# Patient Record
Sex: Male | Born: 1980 | Race: Black or African American | Hispanic: No | Marital: Single | Smoking: Current every day smoker
Health system: Southern US, Community
[De-identification: ages and names within clinical notes are randomized; demographics above are authoritative.]

---

## 2000-10-14 ENCOUNTER — Emergency Department (HOSPITAL_COMMUNITY): Admission: EM | Admit: 2000-10-14 | Discharge: 2000-10-14 | Payer: Self-pay

## 2001-01-14 ENCOUNTER — Encounter: Payer: Self-pay | Admitting: Emergency Medicine

## 2001-01-14 ENCOUNTER — Emergency Department (HOSPITAL_COMMUNITY): Admission: EM | Admit: 2001-01-14 | Discharge: 2001-01-14 | Payer: Self-pay | Admitting: Emergency Medicine

## 2004-01-24 ENCOUNTER — Emergency Department (HOSPITAL_COMMUNITY): Admission: EM | Admit: 2004-01-24 | Discharge: 2004-01-24 | Payer: Self-pay | Admitting: Emergency Medicine

## 2004-09-21 ENCOUNTER — Inpatient Hospital Stay (HOSPITAL_COMMUNITY): Admission: EM | Admit: 2004-09-21 | Discharge: 2004-09-24 | Payer: Self-pay | Admitting: Emergency Medicine

## 2004-12-10 ENCOUNTER — Emergency Department (HOSPITAL_COMMUNITY): Admission: EM | Admit: 2004-12-10 | Discharge: 2004-12-10 | Payer: Self-pay | Admitting: Emergency Medicine

## 2004-12-31 ENCOUNTER — Emergency Department (HOSPITAL_COMMUNITY): Admission: EM | Admit: 2004-12-31 | Discharge: 2004-12-31 | Payer: Self-pay | Admitting: Emergency Medicine

## 2005-02-15 ENCOUNTER — Emergency Department (HOSPITAL_COMMUNITY): Admission: EM | Admit: 2005-02-15 | Discharge: 2005-02-15 | Payer: Self-pay | Admitting: Emergency Medicine

## 2008-08-10 ENCOUNTER — Emergency Department (HOSPITAL_COMMUNITY): Admission: EM | Admit: 2008-08-10 | Discharge: 2008-08-10 | Payer: Self-pay | Admitting: Emergency Medicine

## 2009-12-02 ENCOUNTER — Emergency Department (HOSPITAL_COMMUNITY): Admission: EM | Admit: 2009-12-02 | Discharge: 2009-12-02 | Payer: Self-pay | Admitting: Family Medicine

## 2010-03-11 ENCOUNTER — Emergency Department (HOSPITAL_COMMUNITY)
Admission: EM | Admit: 2010-03-11 | Discharge: 2010-03-11 | Payer: Self-pay | Source: Home / Self Care | Admitting: Emergency Medicine
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
## 2010-08-09 NOTE — Discharge Summary (Signed)
Joe Martin, Joe Martin NO.:  [id number]   MEDICAL RECORD NO.:  [id number]          PATIENT TYPE:  INP   LOCATION:  5033                         FACILITY:  [facility]   PHYSICIAN:  Sandria Bales. Ezzard Standing, M.D.  DATE OF BIRTH:  1980/05/10   DATE OF ADMISSION:  09/21/2004  DATE OF DISCHARGE:                                 DISCHARGE SUMMARY   ADDENDUM:  The patient was unable to be discharged home as directed because  he did not mobilize well with his crutches.  He had a rolling walker ordered  at the physical therapist's recommendation and mobilized fair with that.  He  made decent progress over the next day and was ready to be discharged home  on July 4 in good condition.  Dr. Sherlean Foot will follow him up as directed.       MJ/MEDQ  D:  09/24/2004  T:  09/24/2004  Job:  [id number]
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
## 2011-12-27 ENCOUNTER — Emergency Department (HOSPITAL_COMMUNITY)
Admission: EM | Admit: 2011-12-27 | Discharge: 2011-12-27 | Disposition: A | Discharge status: Home / Self Care | Payer: Self-pay | Source: Home / Self Care | Attending: Emergency Medicine | Admitting: Emergency Medicine

## 2011-12-27 ENCOUNTER — Encounter (HOSPITAL_COMMUNITY): Payer: Self-pay | Admitting: Emergency Medicine

## 2011-12-27 DIAGNOSIS — L0291 Cutaneous abscess, unspecified: Secondary | ICD-10-CM

## 2011-12-27 DIAGNOSIS — L0390 Cellulitis, unspecified: Secondary | ICD-10-CM

## 2011-12-27 DIAGNOSIS — B353 Tinea pedis: Secondary | ICD-10-CM

## 2011-12-27 MED ORDER — DOXYCYCLINE HYCLATE 100 MG PO CAPS: 100.0000 mg | ORAL_CAPSULE | Freq: Two times a day (BID) | ORAL | Status: AC

## 2011-12-27 MED ORDER — NYSTATIN-TRIAMCINOLONE 100000-0.1 UNIT/GM-% EX CREA: TOPICAL_CREAM | CUTANEOUS | Status: AC

## 2011-12-27 MED ORDER — DOXYCYCLINE HYCLATE 100 MG PO CAPS: 100.0000 mg | ORAL_CAPSULE | Freq: Two times a day (BID) | ORAL | Status: DC

## 2011-12-27 MED ORDER — NYSTATIN-TRIAMCINOLONE 100000-0.1 UNIT/GM-% EX CREA: TOPICAL_CREAM | CUTANEOUS | Status: DC

## 2011-12-27 NOTE — ED Notes (Signed)
Pt c/o fungus in between toes of right foot x4 months... Has been seen here in the past for this... Sx include: pain... Denies: fever, nausea, vomiting, diarrhea, drainage... Tried Lamisil w/little relief.

## 2011-12-27 NOTE — ED Provider Notes (Signed)
History     CSN: [id number]  Arrival date & time 12/27/11  1227   First MD Initiated Contact with Patient 12/27/11 1339      Chief Complaint  Patient presents with  . Nail Problem    (Consider location/radiation/quality/duration/timing/severity/associated sxs/prior treatment) HPI Comments: Patient with a recurrent infection on his right foot in between the fourth and fifth toe she describes appointments wet or Foley discharge. It comes and goes he has been treating it for about 4 months some over-the-counter medicines. Patient denies any injury or trauma to his foot. Describe some tenderness in between the space of the fourth and fifth toe of his right foot denies, any fever chills or further symptoms.    The history is provided by the patient.    History reviewed. No pertinent past medical history.  History reviewed. No pertinent past surgical history.  No family history on file.  History  Substance Use Topics  . Smoking status: Never Smoker   . Smokeless tobacco: Not on file  . Alcohol Use: No      Review of Systems  Constitutional: Negative for chills and unexpected weight change.  Skin: Positive for rash.  Neurological: Negative for numbness.    Allergies  Peanut-containing drug products  Home Medications   Current Outpatient Rx  Name Route Sig Dispense Refill  . DOXYCYCLINE HYCLATE 100 MG PO CAPS Oral Take 1 capsule (100 mg total) by mouth 2 (two) times daily. 20 capsule 0  . NYSTATIN-TRIAMCINOLONE 100000-0.1 UNIT/GM-% EX CREA  Apply to affected area daily x 2 weeks 15 g 0    BP 123/87  Pulse 97  Temp 98 F (36.7 C) (Oral)  Resp 18  SpO2 97%  Physical Exam  Nursing note and vitals reviewed. Constitutional: Vital signs are normal. He appears well-developed and well-nourished. He does not have a sickly appearance. He does not appear ill.  Musculoskeletal:       Right foot: He exhibits normal range of motion, no bony tenderness, no swelling, normal  capillary refill, no crepitus and no deformity.       Feet:  Skin: No rash noted. There is erythema.    ED Course  Procedures (including critical care time)  Labs Reviewed - No data to display No results found.   1. Recurrent tinea pedis   2. Cellulitis       MDM  Intertriginous possibly candidal infection with associated cellulitis. Patient was prescribed nystatin transilluminal cream along with cycle of doxycycline. Have encouraged patient to followup if no improvement is noted in 5 days patient agrees and understands treatment plan and followup care as necessary.      Jimmie Molly, MD 12/27/11 2010

## 2012-06-10 ENCOUNTER — Encounter (HOSPITAL_COMMUNITY): Payer: Self-pay | Admitting: *Deleted

## 2012-06-10 ENCOUNTER — Emergency Department (INDEPENDENT_AMBULATORY_CARE_PROVIDER_SITE_OTHER): Payer: Self-pay

## 2012-06-10 ENCOUNTER — Emergency Department (INDEPENDENT_AMBULATORY_CARE_PROVIDER_SITE_OTHER)
Admission: EM | Admit: 2012-06-10 | Discharge: 2012-06-10 | Disposition: A | Discharge status: Home / Self Care | Payer: Self-pay | Source: Home / Self Care | Attending: Family Medicine | Admitting: Family Medicine

## 2012-06-10 DIAGNOSIS — S92919A Unspecified fracture of unspecified toe(s), initial encounter for closed fracture: Secondary | ICD-10-CM

## 2012-06-10 DIAGNOSIS — S92911A Unspecified fracture of right toe(s), initial encounter for closed fracture: Secondary | ICD-10-CM

## 2012-06-10 DIAGNOSIS — B353 Tinea pedis: Secondary | ICD-10-CM

## 2012-06-10 MED ORDER — IBUPROFEN 600 MG PO TABS: 600.0000 mg | ORAL_TABLET | Freq: Three times a day (TID) | ORAL | Status: DC | PRN

## 2012-06-10 MED ORDER — KETOCONAZOLE 2 % EX CREA: TOPICAL_CREAM | Freq: Every day | CUTANEOUS | Status: DC

## 2012-06-10 MED ORDER — TRAMADOL HCL 50 MG PO TABS: 50.0000 mg | ORAL_TABLET | Freq: Four times a day (QID) | ORAL | Status: DC | PRN
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
## 2012-06-10 NOTE — ED Notes (Signed)
lg  Adult     Post  Op  Shoe  Applied     To  r  Foot
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
## 2017-03-20 ENCOUNTER — Ambulatory Visit (HOSPITAL_COMMUNITY)
Admission: EM | Admit: 2017-03-20 | Discharge: 2017-03-20 | Disposition: A | Discharge status: Home / Self Care | Payer: Self-pay | Attending: Internal Medicine | Admitting: Internal Medicine

## 2017-03-20 ENCOUNTER — Encounter (HOSPITAL_COMMUNITY): Payer: Self-pay | Admitting: Emergency Medicine

## 2017-03-20 DIAGNOSIS — L84 Corns and callosities: Secondary | ICD-10-CM

## 2017-03-20 DIAGNOSIS — M79671 Pain in right foot: Secondary | ICD-10-CM

## 2017-03-20 MED ORDER — KETOCONAZOLE 2 % EX CREA: TOPICAL_CREAM | Freq: Every day | CUTANEOUS | 0 refills | Status: DC

## 2017-03-20 MED ORDER — IBUPROFEN 600 MG PO TABS: 600.0000 mg | ORAL_TABLET | Freq: Four times a day (QID) | ORAL | 0 refills | Status: DC | PRN

## 2017-03-20 NOTE — ED Triage Notes (Signed)
PT also wonders if he has athlete's foot.

## 2017-03-20 NOTE — Discharge Instructions (Signed)
Arch support in shoes may be helpful with your pain. Ice and elevate after walking.  Ibuprofen as needed for pain.  Please follow up with podiatry for corn reevaluation and foot pain reevaluation as needed.

## 2017-03-20 NOTE — ED Provider Notes (Signed)
[facility]    CSN: [id number] Arrival date & time: 03/20/17  1607     History   Chief Complaint Chief Complaint  Patient presents with  . Foot Pain    HPI Joe Martin is a 36 y.o. male.   Joe Martin presents with complaints of right plantar foot pain which is worse when he is on his feet for a long period of time. Worse with standing. Without injury. Symptoms have been present for the past 1.5 weeks. Burning pain. Denies previous similar. Has tried another pair of shoes but this did not help. Also with irritation in between pinky toe and 4th toe. Has been applying an ointment which helps. Rates pain 7/10. Has not taken any medications for his symptoms. Without itching.   ROS per HPI.       History reviewed. No pertinent past medical history.  There are no active problems to display for this patient.   History reviewed. No pertinent surgical history.     Home Medications    Prior to Admission medications   Medication Sig Start Date End Date Taking? Authorizing Provider  ibuprofen (ADVIL,MOTRIN) 600 MG tablet Take 1 tablet (600 mg total) by mouth every 6 (six) hours as needed. 03/20/17   Zigmund Gottron, NP  ketoconazole (NIZORAL) 2 % cream Apply topically daily. 03/20/17   Zigmund Gottron, NP  traMADol (ULTRAM) 50 MG tablet Take 1 tablet (50 mg total) by mouth every 6 (six) hours as needed for pain. 06/10/12   Moreno-Coll, Adlih, MD    Family History No family history on file.  Social History Social History   Tobacco Use  . Smoking status: Current Every Day Smoker    Packs/day: 0.30    Types: Cigarettes  . Smokeless tobacco: Never Used  Substance Use Topics  . Alcohol use: Yes  . Drug use: No     Allergies   Peanut-containing drug products   Review of Systems Review of Systems   Physical Exam Triage Vital Signs ED Triage Vitals  Enc Vitals Group     BP 03/20/17 1628 103/87     Pulse Rate 03/20/17 1628 67     Resp 03/20/17  1628 16     Temp 03/20/17 1628 98.6 F (37 C)     Temp Source 03/20/17 1628 Oral     SpO2 03/20/17 1628 100 %     Weight 03/20/17 1628 195 lb (88.5 kg)     Height 03/20/17 1628 5\' 7"  (1.702 m)     Head Circumference --      Peak Flow --      Pain Score 03/20/17 1629 7     Pain Loc --      Pain Edu? --      Excl. in [place]? --    No data found.  Updated Vital Signs BP 103/87   Pulse 67   Temp 98.6 F (37 C) (Oral)   Resp 16   Ht 5\' 7"  (1.702 m)   Wt 195 lb (88.5 kg)   SpO2 100%   BMI 30.54 kg/m   Visual Acuity Right Eye Distance:   Left Eye Distance:   Bilateral Distance:    Right Eye Near:   Left Eye Near:    Bilateral Near:     Physical Exam  Constitutional: He is oriented to person, place, and time. He appears well-developed and well-nourished.  Cardiovascular: Normal rate and regular rhythm.  Pulmonary/Chest: Effort normal and breath sounds normal.  Musculoskeletal:  Right foot: There is normal range of motion, no tenderness, no bony tenderness, no swelling, normal capillary refill, no crepitus, no deformity and no laceration.       Feet:  Corn to medial pinky toe noted; plantar foot without reproducible tenderness; without rash; full sensation, full ROM; indicates pain is at his arch  Neurological: He is alert and oriented to person, place, and time.  Skin: Skin is warm and dry.     UC Treatments / Results  Labs (all labs ordered are listed, but only abnormal results are displayed) Labs Reviewed - No data to display  EKG  EKG Interpretation None       Radiology No results found.  Procedures Procedures (including critical care time)  Medications Ordered in UC Medications - No data to display   Initial Impression / Assessment and Plan / UC Course  I have reviewed the triage vital signs and the nursing notes.  Pertinent labs & imaging results that were available during my care of the patient were reviewed by me and considered in my medical  decision making (see chart for details).     Unable to reproduce pain during exam. Recommended better arch supports as patient's shoes he is wearing currently lack this. May place inserts or get shoes with greater support. Corn present. Without current tinea presence, patient requests refills of ketoconazole for prn use. Recommended podiatry follow up. Ibuprofen, ice, elevation. Patient verbalized understanding and agreeable to plan.  Ambulatory out of clinic without difficulty.    Final Clinical Impressions(s) / UC Diagnoses   Final diagnoses:  Foot pain, right  Corn of toe    ED Discharge Orders        Ordered    ketoconazole (NIZORAL) 2 % cream  Daily     03/20/17 1655    ibuprofen (ADVIL,MOTRIN) 600 MG tablet  Every 6 hours PRN     03/20/17 1655       Controlled Substance Prescriptions [place] Controlled Substance Registry consulted? Not Applicable   Zigmund Gottron, NP 03/20/17 [phone]

## 2017-03-20 NOTE — ED Triage Notes (Signed)
PT reports right foot pain for 1 week. No injury. PT has tried different shoes without relief.

## 2018-02-24 ENCOUNTER — Encounter (HOSPITAL_COMMUNITY): Payer: Self-pay

## 2018-02-24 ENCOUNTER — Ambulatory Visit (HOSPITAL_COMMUNITY)
Admission: EM | Admit: 2018-02-24 | Discharge: 2018-02-24 | Disposition: A | Discharge status: Home / Self Care | Payer: Self-pay | Attending: Family Medicine | Admitting: Family Medicine

## 2018-02-24 ENCOUNTER — Telehealth (HOSPITAL_COMMUNITY): Payer: Self-pay | Admitting: Emergency Medicine

## 2018-02-24 DIAGNOSIS — D649 Anemia, unspecified: Secondary | ICD-10-CM | POA: Insufficient documentation

## 2018-02-24 DIAGNOSIS — R200 Anesthesia of skin: Secondary | ICD-10-CM | POA: Insufficient documentation

## 2018-02-24 DIAGNOSIS — Z791 Long term (current) use of non-steroidal anti-inflammatories (NSAID): Secondary | ICD-10-CM | POA: Insufficient documentation

## 2018-02-24 DIAGNOSIS — R202 Paresthesia of skin: Secondary | ICD-10-CM | POA: Insufficient documentation

## 2018-02-24 DIAGNOSIS — Z79899 Other long term (current) drug therapy: Secondary | ICD-10-CM | POA: Insufficient documentation

## 2018-02-24 DIAGNOSIS — F17210 Nicotine dependence, cigarettes, uncomplicated: Secondary | ICD-10-CM | POA: Insufficient documentation

## 2018-02-24 DIAGNOSIS — G479 Sleep disorder, unspecified: Secondary | ICD-10-CM | POA: Insufficient documentation

## 2018-02-24 DIAGNOSIS — R531 Weakness: Secondary | ICD-10-CM | POA: Insufficient documentation

## 2018-02-24 LAB — POCT URINALYSIS DIP (DEVICE)
BILIRUBIN URINE: NEGATIVE
GLUCOSE, UA: NEGATIVE mg/dL
Ketones, ur: NEGATIVE mg/dL
LEUKOCYTES UA: NEGATIVE
NITRITE: NEGATIVE
Protein, ur: NEGATIVE mg/dL
SPECIFIC GRAVITY, URINE: 1.015 (ref 1.005–1.030)
UROBILINOGEN UA: 0.2 mg/dL (ref 0.0–1.0)
pH: 6 (ref 5.0–8.0)

## 2018-02-24 LAB — TSH: TSH: 0.863 u[IU]/mL (ref 0.350–4.500)

## 2018-02-24 LAB — POCT I-STAT, CHEM 8
BUN: 18 mg/dL (ref 6–20)
CHLORIDE: 101 mmol/L (ref 98–111)
Calcium, Ion: 1.18 mmol/L (ref 1.15–1.40)
Creatinine, Ser: 1 mg/dL (ref 0.61–1.24)
Glucose, Bld: 86 mg/dL (ref 70–99)
HEMATOCRIT: 37 % — AB (ref 39.0–52.0)
HEMOGLOBIN: 12.6 g/dL — AB (ref 13.0–17.0)
POTASSIUM: 3.7 mmol/L (ref 3.5–5.1)
Sodium: 138 mmol/L (ref 135–145)
TCO2: 29 mmol/L (ref 22–32)

## 2018-02-24 NOTE — ED Triage Notes (Signed)
Pt presents with fatigue, weakness, and pain on right side that radiates from leg down to foot.

## 2018-02-24 NOTE — Telephone Encounter (Signed)
TSH WNL.  PT should FU with PCP for further evaluation and management of chronic generalized weakness.

## 2018-02-24 NOTE — Discharge Instructions (Signed)
Blood work did not show signs of electrolyte abnormalities, or elevated blood sugar.  Mild anemia.  Urine showed trace blood.   We will check your thyroid today.  We will call with any abnormal results.   Recommend drinking plenty of water, at least half your body weight in ounces.  Eat a well-balanced diet of lean meat, fruits, and vegetables.   Exercise daily for at least 30 minutes.  Make sure to incorporate both cardio and strength training exercises  Recommending further evaluation and management with PCP PCP assistance initiated Return or go to the ED if you have any new or worsening symptoms such as chest pain, shortness of breath, nausea, vomiting, abdominal pain, lightheadedness, dizziness, etc..Marland Kitchen

## 2018-02-24 NOTE — ED Provider Notes (Signed)
[place]   [id number] 02/24/18 Arrival Time: 2440  CC: Generalized weakness  SUBJECTIVE:  Joe Martin is a 37 y.o. male who complains of generalized weakness for the past couple of months.  States symptoms began after he stopped working out regularly.  Denies recent medication change or starting a new medication.  Admits to poor diet choices, but denies changes in diet.  Denies hx of chronic medical problems.  Take medication for hair loss, unsure of name of medication.  Complains of associated numbness and tingling in his extremities, especially the lower extremities, increased urination, increased thirst, and difficulty sleeping.  Denies aggravating factors.  Tried working out again with minimal relief in numbness and tingling in lower extremities.  Patient denies fever, chills, nausea, vomiting, vision changes, chest pain, SOB, abdominal pain, changes in bowel or bladder habits, hematochezia, melena, temperature sensitivity.    ROS: As per HPI.  Family hx significant for DM in mother and father Father with prostate cancer Mother with MS  History reviewed. No pertinent past medical history. History reviewed. No pertinent surgical history. Allergies  Allergen Reactions  . Peanut-Containing Drug Products    No current facility-administered medications on file prior to encounter.    Current Outpatient Medications on File Prior to Encounter  Medication Sig Dispense Refill  . ibuprofen (ADVIL,MOTRIN) 600 MG tablet Take 1 tablet (600 mg total) by mouth every 6 (six) hours as needed. 30 tablet 0  . ketoconazole (NIZORAL) 2 % cream Apply topically daily. 15 g 0  . traMADol (ULTRAM) 50 MG tablet Take 1 tablet (50 mg total) by mouth every 6 (six) hours as needed for pain. 15 tablet 0   Social History   Socioeconomic History  . Marital status: Married    Spouse name: Not on file  . Number of children: Not on file  . Years of education: Not on file  . Highest education  level: Not on file  Occupational History  . Not on file  Social Needs  . Financial resource strain: Not on file  . Food insecurity:    Worry: Not on file    Inability: Not on file  . Transportation needs:    Medical: Not on file    Non-medical: Not on file  Tobacco Use  . Smoking status: Current Every Day Smoker    Packs/day: 0.30    Types: Cigarettes  . Smokeless tobacco: Never Used  Substance and Sexual Activity  . Alcohol use: Yes  . Drug use: No  . Sexual activity: Not on file  Lifestyle  . Physical activity:    Days per week: Not on file    Minutes per session: Not on file  . Stress: Not on file  Relationships  . Social connections:    Talks on phone: Not on file    Gets together: Not on file    Attends religious service: Not on file    Active member of club or organization: Not on file    Attends meetings of clubs or organizations: Not on file    Relationship status: Not on file  . Intimate partner violence:    Fear of current or ex partner: Not on file    Emotionally abused: Not on file    Physically abused: Not on file    Forced sexual activity: Not on file  Other Topics Concern  . Not on file  Social History Narrative  . Not on file   History reviewed. No pertinent family history.  OBJECTIVE:  Vitals:   02/24/18 1642  BP: 118/84  Pulse: 67  Resp: 18  Temp: 97.9 F (36.6 C)  TempSrc: Oral  SpO2: 98%    General appearance: alert; no distress Eyes: PERRLA; EOMI HENT: normocephalic; atraumatic Neck: supple with FROM Lungs: clear to auscultation bilaterally Heart: regular rate and rhythm.  Radial pulses 2+ symmetrical bilaterally Abdomen: soft, nondistended, normal active bowel sounds; nontender to palpation; no guarding Extremities: no edema; symmetrical with no gross deformities Skin: warm and dry Neurologic: CN 2-12 grossly intact; finger to nose without difficulty; normal gait; strength and sensation intact bilaterally about the upper and  lower extremities Psychological: alert and cooperative; indifferent, but anxious mood and flat affect; slowed speech  Labs: Results for orders placed or performed during the hospital encounter of 02/24/18  TSH  Result Value Ref Range   TSH 0.863 0.350 - 4.500 uIU/mL  POCT urinalysis dip (device)  Result Value Ref Range   Glucose, UA NEGATIVE NEGATIVE mg/dL   Bilirubin Urine NEGATIVE NEGATIVE   Ketones, ur NEGATIVE NEGATIVE mg/dL   Specific Gravity, Urine 1.015 1.005 - 1.030   Hgb urine dipstick TRACE (A) NEGATIVE   pH 6.0 5.0 - 8.0   Protein, ur NEGATIVE NEGATIVE mg/dL   Urobilinogen, UA 0.2 0.0 - 1.0 mg/dL   Nitrite NEGATIVE NEGATIVE   Leukocytes, UA NEGATIVE NEGATIVE  I-STAT, chem 8  Result Value Ref Range   Sodium 138 135 - 145 mmol/L   Potassium 3.7 3.5 - 5.1 mmol/L   Chloride 101 98 - 111 mmol/L   BUN 18 6 - 20 mg/dL   Creatinine, Ser 1.00 0.61 - 1.24 mg/dL   Glucose, Bld 86 70 - 99 mg/dL   Calcium, Ion 1.18 1.15 - 1.40 mmol/L   TCO2 29 22 - 32 mmol/L   Hemoglobin 12.6 (L) 13.0 - 17.0 g/dL   HCT 37.0 (L) 39.0 - 52.0 %   Labs Reviewed  POCT URINALYSIS DIP (DEVICE) - Abnormal; Notable for the following components:      Result Value   Hgb urine dipstick TRACE (*)    All other components within normal limits  POCT I-STAT, CHEM 8 - Abnormal; Notable for the following components:   Hemoglobin 12.6 (*)    HCT 37.0 (*)    All other components within normal limits  TSH    ASSESSMENT & PLAN:  1. Generalized weakness   2. Numbness and tingling of both lower extremities     No orders of the defined types were placed in this encounter.  TSH WNL  Blood work did not show signs of electrolyte abnormalities, or elevated blood sugar.  Mild anemia.  Urine showed trace blood.   We will check your thyroid today.  We will call with any abnormal results.   Recommend drinking plenty of water, at least half your body weight in ounces.  Eat a well-balanced diet of lean meat,  fruits, and vegetables.   Exercise daily for at least 30 minutes.  Make sure to incorporate both cardio and strength training exercises  Recommending further evaluation and management with PCP PCP assistance initiated Return or go to the ED if you have any new or worsening symptoms such as chest pain, shortness of breath, nausea, vomiting, abdominal pain, lightheadedness, dizziness, etc...  Reviewed expectations re: course of current medical issues. Questions answered. Outlined signs and symptoms indicating need for more acute intervention. Patient verbalized understanding. After Visit Summary given.   Lestine Box, PA-C 02/24/18 2113

## 2018-03-09 ENCOUNTER — Other Ambulatory Visit: Payer: Self-pay

## 2018-03-09 ENCOUNTER — Encounter: Payer: Self-pay | Admitting: Nurse Practitioner

## 2018-03-09 ENCOUNTER — Ambulatory Visit: Payer: Self-pay | Attending: Nurse Practitioner | Admitting: Nurse Practitioner

## 2018-03-09 ENCOUNTER — Encounter

## 2018-03-09 VITALS — BP 134/90 | HR 63 | Temp 98.8°F | Ht 67.0 in | Wt 213.0 lb

## 2018-03-09 DIAGNOSIS — F17210 Nicotine dependence, cigarettes, uncomplicated: Secondary | ICD-10-CM | POA: Insufficient documentation

## 2018-03-09 DIAGNOSIS — M6281 Muscle weakness (generalized): Secondary | ICD-10-CM | POA: Insufficient documentation

## 2018-03-09 DIAGNOSIS — M79671 Pain in right foot: Secondary | ICD-10-CM | POA: Insufficient documentation

## 2018-03-09 NOTE — Patient Instructions (Signed)
Neuropathic Pain Neuropathic pain is pain caused by damage to the nerves that are responsible for certain sensations in your body (sensory nerves). The pain can be caused by damage to:  The sensory nerves that send signals to your spinal cord and brain (peripheral nervous system).  The sensory nerves in your brain or spinal cord (central nervous system).  Neuropathic pain can make you more sensitive to pain. What would be a minor sensation for most people may feel very painful if you have neuropathic pain. This is usually a long-term condition that can be difficult to treat. The type of pain can differ from person to person. It may start suddenly (acute), or it may develop slowly and last for a long time (chronic). Neuropathic pain may come and go as damaged nerves heal or may stay at the same level for years. It often causes emotional distress, loss of sleep, and a lower quality of life. What are the causes? The most common cause of damage to a sensory nerve is diabetes. Many other diseases and conditions can also cause neuropathic pain. Causes of neuropathic pain can be classified as:  Toxic. Many drugs and chemicals can cause toxic damage. The most common cause of toxic neuropathic pain is damage from drug treatment for cancer (chemotherapy).  Metabolic. This type of pain can happen when a disease causes imbalances that damage nerves. Diabetes is the most common of these diseases. Vitamin B deficiency caused by long-term alcohol abuse is another common cause.  Traumatic. Any injury that cuts, crushes, or stretches a nerve can cause damage and pain. A common example is feeling pain after losing an arm or leg (phantom limb pain).  Compression-related. If a sensory nerve gets trapped or compressed for a long period of time, the blood supply to the nerve can be cut off.  Vascular. Many blood vessel diseases can cause neuropathic pain by decreasing blood supply and oxygen to nerves.  Autoimmune.  This type of pain results from diseases in which the body's defense system mistakenly attacks sensory nerves. Examples of autoimmune diseases that can cause neuropathic pain include lupus and multiple sclerosis.  Infectious. Many types of viral infections can damage sensory nerves and cause pain. Shingles infection is a common cause of this type of pain.  Inherited. Neuropathic pain can be a symptom of many diseases that are passed down through families (genetic).  What are the signs or symptoms? The main symptom is pain. Neuropathic pain is often described as:  Burning.  Shock-like.  Stinging.  Hot or cold.  Itching.  How is this diagnosed? No single test can diagnose neuropathic pain. Your health care provider will do a physical exam and ask you about your pain. You may use a pain scale to describe how bad your pain is. You may also have tests to see if you have a high sensitivity to pain and to help find the cause and location of any sensory nerve damage. These tests may include:  Imaging studies, such as: ? X-rays. ? CT scan. ? MRI.  Nerve conduction studies to test how well nerve signals travel through your sensory nerves (electrodiagnostic testing).  Stimulating your sensory nerves through electrodes on your skin and measuring the response in your spinal cord and brain (somatosensory evoked potentials).  How is this treated? Treatment for neuropathic pain may change over time. You may need to try different treatment options or a combination of treatments. Some options include:  Over-the-counter pain relievers.  Prescription medicines. Some medicines   used to treat other conditions may also help neuropathic pain. These include medicines to: ? Control seizures (anticonvulsants). ? Relieve depression (antidepressants).  Prescription-strength pain relievers (narcotics). These are usually used when other pain relievers do not help.  Transcutaneous nerve stimulation (TENS).  This uses electrical currents to block painful nerve signals. The treatment is painless.  Topical and local anesthetics. These are medicines that numb the nerves. They can be injected as a nerve block or applied to the skin.  Alternative treatments, such as: ? Acupuncture. ? Meditation. ? Massage. ? Physical therapy. ? Pain management programs. ? Counseling.  Follow these instructions at home:  Learn as much as you can about your condition.  Take medicines only as directed by your health care provider.  Work closely with all your health care providers to find what works best for you.  Have a good support system at home.  Consider joining a chronic pain support group. Contact a health care provider if:  Your pain treatments are not helping.  You are having side effects from your medicines.  You are struggling with fatigue, mood changes, depression, or anxiety. This information is not intended to replace advice given to you by your health care provider. Make sure you discuss any questions you have with your health care provider. Document Released: 12/06/2003 Document Revised: 09/28/2015 Document Reviewed: 08/18/2013 Elsevier Interactive Patient Education  2018 [employer].  Weakness Weakness is a lack of strength. You may feel weak all over your body (generalized), or you may feel weak in one specific part of your body (focal). There are many potential causes of weakness. Sometimes, the cause of your weakness may not be known. Some causes of weakness can be serious, so it is important to see your doctor. Follow these instructions at home:  Rest as needed.  Try to get enough sleep. Talk to your doctor about how much sleep you need each night.  Take over-the-counter and prescription medicines only as told by your doctor.  Eat a healthy, well-balanced diet. This includes: ? Proteins to build muscles, such as lean meats and fish. ? Fresh fruits and  vegetables. ? Carbohydrates to boost energy, such as whole grains.  Drink enough fluid to keep your pee (urine) clear or pale yellow.  Do strength exercises, such as arm curls and leg raises, for 30 minutes at least 2 days a week or as told by your doctor.  Think about working with a physical therapist or trainer to help you get stronger.  Keep all follow-up visits as told by your doctor. This is important. Contact a doctor if:  Your weakness does not get better or it gets worse.  Your weakness affects your ability to: ? Think clearly. ? Do your normal daily activities. Get help right away if:  You have sudden weakness.  You have trouble breathing or shortness of breath.  You have problems with your vision.  You have trouble talking or swallowing.  You have trouble standing or walking.  You have chest pain.  You are light-headed.  You pass out (lose consciousness). This information is not intended to replace advice given to you by your health care provider. Make sure you discuss any questions you have with your health care provider. Document Released: 02/21/2008 Document Revised: 04/05/2015 Document Reviewed: 12/29/2014 Elsevier Interactive Patient Education  [employer].

## 2018-03-09 NOTE — Progress Notes (Signed)
Assessment & Plan:  Joe Martin was seen today for establish care.  Diagnoses and all orders for this visit:  Muscle weakness of left arm Unremarkable physical exam will continue to monitor.  Based on physical exam today which did not reveal any neurological abnormalities, no additional neurological work-up recommended at this time.   Patient has been counseled on age-appropriate routine health concerns for screening and prevention. These are reviewed and up-to-date. Referrals have been placed accordingly. Immunizations are up-to-date or declined.    Subjective:   Chief Complaint  Patient presents with  . Establish Care   HPI Joe Martin 37 y.o. male presents to office today to establish care. He endorses decreased muscle strength on the left upper extremity.  Symptoms are more noticeable when attempting to perform push-ups or chest presses.  He denies any neurological deficits such as falls, ataxia, paresthesia or hemiparesis.  He also endorses neuropathic pain in the right foot which initially had radiated to his upper right thigh but is currently now localized to the right foot again.  He is a chronic smoker.   Upon physical exam I do not notice any motor or sensory deficits.  I have instructed him that we would consider an ABI or nerve conduction study in the near future however patient has been advised to apply for financial assistance and schedule to see our financial counselor.  He states his mother was diagnosed with MS and he is concerned that he may have inherited similar neurological issues.   Review of Systems  Constitutional: Negative for fever, malaise/fatigue and weight loss.  HENT: Negative.  Negative for nosebleeds.   Eyes: Negative.  Negative for blurred vision, double vision and photophobia.  Respiratory: Negative.  Negative for cough and shortness of breath.   Cardiovascular: Negative.  Negative for chest pain, palpitations and leg swelling.  Gastrointestinal:  Negative.  Negative for heartburn, nausea and vomiting.  Musculoskeletal: Negative.  Negative for back pain, falls, joint pain, myalgias and neck pain.  Neurological: Positive for tingling, sensory change and focal weakness (See HPI). Negative for dizziness, speech change, seizures, loss of consciousness, weakness and headaches.  Psychiatric/Behavioral: Negative.  Negative for suicidal ideas.    No past medical history on file.  No past surgical history on file.  No family history on file.  Social History Reviewed with no changes to be made today.   Outpatient Medications Prior to Visit  Medication Sig Dispense Refill  . ibuprofen (ADVIL,MOTRIN) 600 MG tablet Take 1 tablet (600 mg total) by mouth every 6 (six) hours as needed. 30 tablet 0  . ketoconazole (NIZORAL) 2 % cream Apply topically daily. (Patient not taking: Reported on 03/09/2018) 15 g 0  . traMADol (ULTRAM) 50 MG tablet Take 1 tablet (50 mg total) by mouth every 6 (six) hours as needed for pain. (Patient not taking: Reported on 03/09/2018) 15 tablet 0   No facility-administered medications prior to visit.     Allergies  Allergen Reactions  . Peanut-Containing Drug Products        Objective:    BP 134/90   Pulse 63   Temp 98.8 F (37.1 C) (Oral)   Ht 5\' 7"  (1.702 m)   Wt 213 lb (96.6 kg)   SpO2 98%   BMI 33.36 kg/m  Wt Readings from Last 3 Encounters:  03/09/18 213 lb (96.6 kg)  03/20/17 195 lb (88.5 kg)    Physical Exam Vitals signs and nursing note reviewed.  Constitutional:  Appearance: He is well-developed.  HENT:     Head: Normocephalic and atraumatic.  Neck:     Musculoskeletal: Normal range of motion.  Cardiovascular:     Rate and Rhythm: Normal rate and regular rhythm.     Heart sounds: Normal heart sounds. No murmur. No friction rub. No gallop.   Pulmonary:     Effort: Pulmonary effort is normal. No tachypnea or respiratory distress.     Breath sounds: Normal breath sounds. No decreased  breath sounds, wheezing, rhonchi or rales.  Chest:     Chest wall: No tenderness.  Abdominal:     General: Bowel sounds are normal.     Palpations: Abdomen is soft.  Musculoskeletal: Normal range of motion.     Left shoulder: Normal.     Left elbow: Normal.     Left wrist: Normal.     Left upper arm: Normal.     Left forearm: Normal.     Left hand: Normal. He exhibits normal range of motion. Normal sensation noted. Normal strength noted.  Skin:    General: Skin is warm and dry.  Neurological:     Mental Status: He is alert and oriented to person, place, and time.     Cranial Nerves: Cranial nerves are intact. No cranial nerve deficit, dysarthria or facial asymmetry.     Sensory: Sensation is intact.     Motor: Motor function is intact. No tremor or seizure activity.     Coordination: Coordination is intact. Romberg sign negative. Coordination normal. Finger-Nose-Finger Test and Heel to [facility] Test normal.     Gait: Gait is intact. Gait normal.  Psychiatric:        Behavior: Behavior normal. Behavior is cooperative.        Thought Content: Thought content normal.        Judgment: Judgment normal.          Patient has been counseled extensively about nutrition and exercise as well as the importance of adherence with medications and regular follow-up. The patient was given clear instructions to go to ER or return to medical center if symptoms don't improve, worsen or new problems develop. The patient verbalized understanding.   Follow-up: Return if symptoms worsen or fail to improve, for Needs appointment with financial representative.Gildardo Pounds, FNP-BC [facility] and [facility] [place], [place]   03/09/2018, 6:09 PM

## 2018-03-29 ENCOUNTER — Ambulatory Visit: Payer: BLUE CROSS/BLUE SHIELD | Attending: Nurse Practitioner

## 2018-03-31 ENCOUNTER — Ambulatory Visit (HOSPITAL_BASED_OUTPATIENT_CLINIC_OR_DEPARTMENT_OTHER): Payer: BLUE CROSS/BLUE SHIELD | Admitting: Physician Assistant

## 2018-03-31 ENCOUNTER — Ambulatory Visit (HOSPITAL_COMMUNITY)
Admission: RE | Admit: 2018-03-31 | Discharge: 2018-03-31 | Disposition: A | Discharge status: Home / Self Care | Payer: BLUE CROSS/BLUE SHIELD | Source: Ambulatory Visit | Attending: Physician Assistant | Admitting: Physician Assistant

## 2018-03-31 VITALS — BP 126/86 | HR 67 | Temp 98.1°F | Resp 16 | Wt 213.6 lb

## 2018-03-31 DIAGNOSIS — R202 Paresthesia of skin: Secondary | ICD-10-CM

## 2018-03-31 DIAGNOSIS — R29898 Other symptoms and signs involving the musculoskeletal system: Secondary | ICD-10-CM | POA: Diagnosis present

## 2018-03-31 DIAGNOSIS — M791 Myalgia, unspecified site: Secondary | ICD-10-CM | POA: Diagnosis not present

## 2018-03-31 DIAGNOSIS — R52 Pain, unspecified: Secondary | ICD-10-CM

## 2018-03-31 NOTE — Progress Notes (Signed)
Patient ID: NALIN MAZZOCCO, male   DOB: [birth year], 38 y.o.   MRN: [id number]   Jeanpierre Thebeau, is a 38 y.o. male  LOV:[id number]  OAC:[id number]  DOB - 07-23-1980  Subjective:  Chief Complaint and HPI: Jimmey Hengel is a 38 y.o. male here today for a follow up visit about L arm weakness and R lower leg/foot paraesthesias.  He is R hand dominant.  He does not feel his symptoms have worsened but he is very concerned about them.  He denies a neck or back injury.  No accidental tripping or dropping items.  No vision changes.  He is concerned about MS or nerve damage.  His mom has MS.    ED/Hospital notes reviewed.   Social History: Family history:  ROS:   Constitutional:  No f/c, No night sweats, No unexplained weight loss. EENT:  No vision changes, No blurry vision, No hearing changes. No mouth, throat, or ear problems.  Respiratory: No cough, No SOB Cardiac: No CP, no palpitations GI:  No abd pain, No N/V/D. GU: No Urinary s/sx Musculoskeletal: see above Neuro: No headache, no dizziness, no motor weakness.  Skin: No rash Endocrine:  No polydipsia. No polyuria.  Psych: Denies SI/HI  No problems updated.  ALLERGIES: Allergies  Allergen Reactions  . Peanut-Containing Drug Products     PAST MEDICAL HISTORY: No past medical history on file.  MEDICATIONS AT HOME: Prior to Admission medications   Not on File     Objective:  EXAM:   Vitals:   03/31/18 1606  BP: 126/86  Pulse: 67  Resp: 16  Temp: 98.1 F (36.7 C)  TempSrc: Oral  SpO2: 96%  Weight: 213 lb 9.6 oz (96.9 kg)    General appearance : A&OX3. NAD. Non-toxic-appearing HEENT: Atraumatic and Normocephalic.  PERRLA. EOM intact.   Neck: supple, no JVD. No cervical lymphadenopathy. No thyromegaly Chest/Lungs:  Breathing-non-labored, Good air entry bilaterally, breath sounds normal without rales, rhonchi, or wheezing  CVS: S1 S2 regular, no murmurs, gallops, rubs  LUE compared to R.  Normal S&ROM.  Normal  grip strength.  Sensory intact throughout.  UE DTR=B. There is NO perceivable difference in strength of U/L ext.    RLE and foot examined compared to L.  Pulse/N-V intact.  DTR =B.  S&ROM WNL.   Extremities: Bilateral Lower Ext shows no edema, both legs are warm to touch with = pulse throughout Neurology:  CN II-XII grossly intact, Non focal.   Psych:  TP linear. J/I WNL. Normal speech. Appropriate eye contact and affect.  Skin:  No Rash  Data Review No results found for: HGBA1C   Assessment & Plan   1. Right leg paresthesias No red flags or objective findings. - DG Lumbar Spine Complete; Future - B12 and Folate Panel  2. Left arm weakness No red flags or objective findings.   - DG Cervical Spine Complete; Future  3. Body aches - Vitamin D, 25-hydroxy - Sedimentation Rate   Patient have been counseled extensively about nutrition and exercise  Return in about 1 month (around 05/01/2018) for Zelda; f/up L arm weakness; R leg pain.  The patient was given clear instructions to go to ER or return to medical center if symptoms don't improve, worsen or new problems develop. The patient verbalized understanding. The patient was told to call to get lab results if they haven't heard anything in the next week.     Freeman Caldron, PA-C [facility] and [facility] [place], [place]  03/31/2018, 4:21 PM

## 2018-04-02 ENCOUNTER — Other Ambulatory Visit: Payer: Self-pay | Admitting: Physician Assistant

## 2018-04-02 DIAGNOSIS — R531 Weakness: Secondary | ICD-10-CM

## 2018-04-02 DIAGNOSIS — R9389 Abnormal findings on diagnostic imaging of other specified body structures: Secondary | ICD-10-CM

## 2018-04-13 ENCOUNTER — Telehealth: Payer: Self-pay

## 2018-04-13 NOTE — Telephone Encounter (Signed)
Contacted pt to go over xray results pt is aware and doesn't have any questions or concerns  

## 2018-06-03 ENCOUNTER — Telehealth: Payer: Self-pay | Admitting: Nurse Practitioner

## 2018-06-03 NOTE — Telephone Encounter (Signed)
Patient called in regards to neurology referral states clinic where referred to is not within network for his insurance. Please follow up.

## 2018-06-07 NOTE — Telephone Encounter (Signed)
I spoke to patient and let him know that his referral was sent to [facility]  And provide him with the phone number

## 2018-07-07 ENCOUNTER — Encounter: Payer: Self-pay | Admitting: Nurse Practitioner

## 2018-07-07 ENCOUNTER — Ambulatory Visit: Payer: BLUE CROSS/BLUE SHIELD | Attending: Nurse Practitioner | Admitting: Nurse Practitioner

## 2018-07-07 ENCOUNTER — Other Ambulatory Visit: Payer: Self-pay

## 2018-07-07 DIAGNOSIS — R202 Paresthesia of skin: Secondary | ICD-10-CM | POA: Diagnosis not present

## 2018-07-07 DIAGNOSIS — M6281 Muscle weakness (generalized): Secondary | ICD-10-CM

## 2018-07-07 NOTE — Progress Notes (Deleted)
   Assessment & Plan:  Joe Martin was seen today for follow-up.  Diagnoses and all orders for this visit:  Muscle weakness of left arm  Paresthesia of right foot    Patient has been counseled on age-appropriate routine health concerns for screening and prevention. These are reviewed and up-to-date. Referrals have been placed accordingly. Immunizations are up-to-date or declined.    Subjective:   Chief Complaint  Patient presents with  . Follow-up    Pt. stated his foot pain is still there and still feel weakness on his left arm.    HPI Joe Martin 38 y.o. male presents to office today   ROS  History reviewed. No pertinent past medical history.  History reviewed. No pertinent surgical history.  History reviewed. No pertinent family history.  Social History Reviewed with no changes to be made today.   No outpatient medications prior to visit.   No facility-administered medications prior to visit.     Allergies  Allergen Reactions  . Peanut-Containing Drug Products        Objective:    There were no vitals taken for this visit. Wt Readings from Last 3 Encounters:  03/31/18 213 lb 9.6 oz (96.9 kg)  03/09/18 213 lb (96.6 kg)  03/20/17 195 lb (88.5 kg)    Physical Exam       Patient has been counseled extensively about nutrition and exercise as well as the importance of adherence with medications and regular follow-up. The patient was given clear instructions to go to ER or return to medical center if symptoms don't improve, worsen or new problems develop. The patient verbalized understanding.   Follow-up: No follow-ups on file.   Gildardo Pounds, FNP-BC [employer] and [place], [place]   07/07/2018, 2:57 PM

## 2018-07-09 NOTE — Progress Notes (Signed)
Virtual Visit via Telephone Note  I connected with Joe Martin on 07/07/18 at  2:45 PM EDT by telephone and verified that I am speaking with the correct person using two identifiers.   I discussed the limitations, risks, security and privacy concerns of performing an evaluation and management service by telephone and the availability of in person appointments. I also discussed with the patient that there may be a patient responsible charge related to this service. The patient expressed understanding and agreed to proceed.   History of Present Illness: He continues with persistent complaints of right leg paresthesia and left arm muscle weakness. Onset several months ago. He is very concerned he may eventually be diagnosed with MS as his mother was diagnosed and he believes he may inherit it. I have not not been able to appreciate any deficits on previous exams.  He was referred to [facility] but states he has not heard from their office. I have given him the phone number to their office today. I will also place another referral.  Lumbar xray showed:  1. Age-indeterminate mild (approximately 25%) compression deformity involving the superior endplate of J19. Correlation for point tenderness at this location is advised. 2. 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 He will be referred to     Observations/Objective: Alert and Oriented x 3.    Assessment and Plan:  Diagnoses and all orders for this visit:  Muscle weakness of left arm -     Ambulatory referral to Neurosurgery  Paresthesia of right foot -     Ambulatory referral to Neurosurgery    Follow Up Instructions:  Return in about 3 months (around 10/06/2018).  I discussed the assessment and treatment plan with the patient. The patient was provided an opportunity to ask questions and all were answered. The patient agreed with the plan and demonstrated an understanding of the instructions.   The patient  was advised to call back or seek an in-person evaluation if the symptoms worsen or if the condition fails to improve as anticipated.  I provided 15 minutes of non-face-to-face time during this encounter.   Gildardo Pounds, NP

## 2018-07-11 ENCOUNTER — Encounter: Payer: Self-pay | Admitting: Nurse Practitioner

## 2018-07-12 ENCOUNTER — Encounter: Payer: Self-pay | Admitting: Podiatry

## 2018-07-12 ENCOUNTER — Other Ambulatory Visit: Payer: Self-pay | Admitting: Podiatry

## 2018-07-12 ENCOUNTER — Ambulatory Visit (INDEPENDENT_AMBULATORY_CARE_PROVIDER_SITE_OTHER): Payer: BLUE CROSS/BLUE SHIELD

## 2018-07-12 ENCOUNTER — Ambulatory Visit (INDEPENDENT_AMBULATORY_CARE_PROVIDER_SITE_OTHER): Payer: BLUE CROSS/BLUE SHIELD | Admitting: Podiatry

## 2018-07-12 ENCOUNTER — Other Ambulatory Visit: Payer: Self-pay

## 2018-07-12 VITALS — BP 130/97 | HR 76 | Temp 98.2°F | Resp 16

## 2018-07-12 DIAGNOSIS — M79672 Pain in left foot: Secondary | ICD-10-CM

## 2018-07-12 DIAGNOSIS — M779 Enthesopathy, unspecified: Secondary | ICD-10-CM

## 2018-07-12 DIAGNOSIS — M7751 Other enthesopathy of right foot: Secondary | ICD-10-CM

## 2018-07-12 DIAGNOSIS — M79671 Pain in right foot: Secondary | ICD-10-CM

## 2018-07-12 DIAGNOSIS — M7752 Other enthesopathy of left foot: Secondary | ICD-10-CM | POA: Diagnosis not present

## 2018-07-12 MED ORDER — CYCLOBENZAPRINE HCL 10 MG PO TABS: 10.0000 mg | ORAL_TABLET | Freq: Three times a day (TID) | ORAL | 0 refills | Status: DC | PRN

## 2018-07-12 MED ORDER — DICLOFENAC SODIUM 75 MG PO TBEC: 75.0000 mg | DELAYED_RELEASE_TABLET | Freq: Two times a day (BID) | ORAL | 2 refills | Status: DC

## 2018-07-12 NOTE — Progress Notes (Signed)
   Subjective:    Patient ID: Joe Martin, male    DOB: 1981/03/22, 38 y.o.   MRN: [id number]  HPI    Review of Systems  All other systems reviewed and are negative.      Objective:   Physical Exam        Assessment & Plan:

## 2018-07-12 NOTE — Progress Notes (Signed)
Subjective:   Patient ID: Joe Martin, male   DOB: 38 y.o.   MRN: [id number]   HPI Patient states he is been getting some pain in his right foot and left foot for about 6 months.  Patient states that it seems to burn there is concerned about her previous swelling and also has had some hip pain with shooting pain and is due to see a neurologist in the next couple of weeks.  Patient smokes a third of a pack per day and likes to be active but is having trouble because of discomfort   Review of Systems  All other systems reviewed and are negative.       Objective:  Physical Exam Vitals signs and nursing note reviewed.  Constitutional:      Appearance: He is well-developed.  Pulmonary:     Effort: Pulmonary effort is normal.  Musculoskeletal: Normal range of motion.  Skin:    General: Skin is warm.  Neurological:     Mental Status: He is alert.     Neurovascular status intact muscle strength is adequate range of motion within normal limits.  Patient is noted to have significant flatfoot deformity bilateral with excessive eversion and does have some protruding of the navicular bone bilateral with inflammation around the posterior tibial tendon.  Patient does not have any indications of muscle strength loss in anterior tibial muscle bilateral with strong     Assessment:  Possibility that this is a tendinitis condition versus possibility that there may be some form of back inflammatory condition with muscle irritation     Plan:  H&P x-rays reviewed and today I did dispense fascial braces bilateral.  As precautionary measure I placed him on oral anti-inflammatory diclofenac and also Flexeril to help with muscle spasm.  He is to see his neurologist and I gave him strict instructions of any muscle weakness any other pathology were to occur he is to be seen immediately  X-rays indicate significant flatness the arch bilateral with accessory navicular noted bilateral head no indications  of coalition

## 2018-07-27 ENCOUNTER — Telehealth: Payer: Self-pay | Admitting: Nurse Practitioner

## 2018-07-27 NOTE — Telephone Encounter (Signed)
Pt called in wanting to have another referral for x-ray at neurologist for additional x-rays please follow up

## 2018-07-30 NOTE — Telephone Encounter (Signed)
CMA spoke to the [facility] and was informed that the XRAY cervical and lumbar did not tell them much and they are requesting if PCP can order patient cervical MRI and Lumbar MRI so he can be seen by the neurologist.

## 2018-08-02 ENCOUNTER — Other Ambulatory Visit: Payer: Self-pay | Admitting: Nurse Practitioner

## 2018-08-02 DIAGNOSIS — G1220 Motor neuron disease, unspecified: Secondary | ICD-10-CM

## 2018-08-02 DIAGNOSIS — R202 Paresthesia of skin: Secondary | ICD-10-CM

## 2018-08-02 NOTE — Telephone Encounter (Signed)
Bien I have ordered. Please schedule or let me know if needs pre auth

## 2018-08-06 ENCOUNTER — Other Ambulatory Visit: Payer: Self-pay

## 2018-08-06 ENCOUNTER — Ambulatory Visit (HOSPITAL_COMMUNITY)
Admission: RE | Admit: 2018-08-06 | Discharge: 2018-08-06 | Disposition: A | Discharge status: Home / Self Care | Payer: BLUE CROSS/BLUE SHIELD | Source: Ambulatory Visit | Attending: Nurse Practitioner | Admitting: Nurse Practitioner

## 2018-08-06 ENCOUNTER — Telehealth: Payer: Self-pay | Admitting: Nurse Practitioner

## 2018-08-06 DIAGNOSIS — R202 Paresthesia of skin: Secondary | ICD-10-CM | POA: Diagnosis present

## 2018-08-06 DIAGNOSIS — G1220 Motor neuron disease, unspecified: Secondary | ICD-10-CM | POA: Insufficient documentation

## 2018-08-06 NOTE — Telephone Encounter (Signed)
[employer] stated they need physician to do a peer to peer [phone].

## 2018-08-06 NOTE — Telephone Encounter (Signed)
Currently waiting for PCP to do a peer to peer for patient prior auth.

## 2018-08-06 NOTE — Telephone Encounter (Signed)
Berta Minor with [facility] is calling in regards to prior authorization for patient. Please follow up.    States the best way to reach him is through email.  [email or web address]

## 2018-08-09 ENCOUNTER — Other Ambulatory Visit: Payer: Self-pay | Admitting: Nurse Practitioner

## 2018-08-09 DIAGNOSIS — R202 Paresthesia of skin: Secondary | ICD-10-CM

## 2018-08-09 NOTE — Telephone Encounter (Signed)
Called P2P number was unable to speak with physician as I had a patient call coming through. Will attempt to call back .

## 2018-08-18 NOTE — Telephone Encounter (Signed)
CMA attempt to call patient to inform on results.  No answer and left a VM.

## 2018-08-18 NOTE — Telephone Encounter (Signed)
-----   Message from Gildardo Pounds, NP sent at 08/11/2018 10:19 PM EDT ----- MRI of cervical spine and back show bulging discs. Referral has been placed to neurosurgery

## 2018-09-25 ENCOUNTER — Other Ambulatory Visit: Payer: Self-pay | Admitting: Nurse Practitioner

## 2018-09-25 DIAGNOSIS — R202 Paresthesia of skin: Secondary | ICD-10-CM

## 2018-10-21 ENCOUNTER — Encounter: Payer: Self-pay | Admitting: Neurology

## 2018-10-21 ENCOUNTER — Other Ambulatory Visit: Payer: Self-pay

## 2018-10-21 ENCOUNTER — Ambulatory Visit (INDEPENDENT_AMBULATORY_CARE_PROVIDER_SITE_OTHER): Payer: BLUE CROSS/BLUE SHIELD | Admitting: Neurology

## 2018-10-21 VITALS — BP 124/85 | HR 82 | Temp 98.7°F | Ht 67.0 in | Wt 202.0 lb

## 2018-10-21 DIAGNOSIS — H02402 Unspecified ptosis of left eyelid: Secondary | ICD-10-CM | POA: Insufficient documentation

## 2018-10-21 DIAGNOSIS — R29898 Other symptoms and signs involving the musculoskeletal system: Secondary | ICD-10-CM | POA: Diagnosis not present

## 2018-10-21 DIAGNOSIS — R208 Other disturbances of skin sensation: Secondary | ICD-10-CM | POA: Diagnosis not present

## 2018-10-21 DIAGNOSIS — G379 Demyelinating disease of central nervous system, unspecified: Secondary | ICD-10-CM

## 2018-10-21 DIAGNOSIS — G959 Disease of spinal cord, unspecified: Secondary | ICD-10-CM

## 2018-10-21 DIAGNOSIS — R449 Unspecified symptoms and signs involving general sensations and perceptions: Secondary | ICD-10-CM | POA: Insufficient documentation

## 2018-10-21 DIAGNOSIS — Z79899 Other long term (current) drug therapy: Secondary | ICD-10-CM

## 2018-10-21 NOTE — Progress Notes (Signed)
Provider:  Larey Seat, M D  Referring Provider: Gildardo Pounds, NP Primary Care Physician:  Joe Pounds, NP  Chief Complaint  Patient presents with   New Patient (Initial Visit)    pt alone, rm 10. pt states that he was having numbness in feet and weakness. bilateral feet pain/numbness.     HPI:  Joe Martin is a 38 y.o. male  Is seen here as a referral  from NP  [facility] for follow up on recently obtained MRI lumbar and cervical spine.  Patient was referred to [facility] , but could never reach their office by phone and was never invited to a consultation.   I had the pleasure of meeting Joe Martin today who is a right-handed African-American gentleman age 38 and has last seen his referring nurse practitioner Mrs. Joe Martin on 07 July 2018.  This was a virtual visit via via telephone.  The patient had continued to persist with complaints of right leg paresthesia and left arm weakness.  The onset was at the beginning of the year.  A lumbar x-ray was first obtained showing only mild compression deformity and very mild degenerative disc disease not explaining the patient's symptoms.  He was referred to neurosurgery for both the muscle weakness of the left arm and the paresthesia of the right foot and asked to return on 06 October 2018 for a visit.  I have the second note here from podiatrist Joe Martin obtained on 07-12-2018.  He prescribed an anti-inflammatory diclofenac and also recommended Flexeril to help with muscle spasms.  He mentioned that neurovascular status was intact, he has some flatfoot deformity bilaterally and was suspected to have a tendinitis.  The patient finally underwent an MRI of the lumbar spine and cervical spine both without contrast.  The studies were obtained on 06 Aug 2018 the lumbar spine showed no fracture, mild or minimal degenerative changes no suspicious lesions just minor osteophytes which is meeting bone spurs.  No significant stenosis.  Mild  bilateral neural foraminal stenosis at the expected L5-S1 region.  And a really mild disc bulge.  No spinal stenosis that would justify or explain the patient's symptoms.  As to the cervical spine MRI also obtained without contrast on 15 May there is no major bony abnormality either, the cord shows T2 hyperintense lesions in the left aspect of the cord extending from C5-6 C6 about 2 cm in length and estimates similar lesion is found lower towards the right anterior aspect of the cord with a length of 1.8 cm spanning from C6 to see 7.  Smaller lesions were noticed left-sided at C4 and on the right at C4 and 5 and this seems to be a mild cord expansion associated with the left-sided lesion.  There was only one region of moderate right-sided spinal stenosis at C5-C6 this could explain some of the arm pain or weakness.  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  Mr. Joe Aquas. Martin has advised me that his mother has multiple sclerosis and he had wondered if this disease could affect him.    Social history- no regular work out since 2019, smoker 10 cigarettes, Caffeine- soda daily.  Alcohol; 4-5 a week.  He is working - as daytime assembly line work.   he sometimes unloads trucks.    Review of Systems: Out of a complete 14 system review, the patient complains of only the following symptoms, and all other reviewed systems  are negative.  He reports difficulties with weight lifting, left arm and hand, loss of grip strength.   Right leg tingling- now affecting both legs. Pins and needles,   Left eye ptosis and abnormal eye movements, left eye shut when looking to the left.   Social History   Socioeconomic History   Marital status: Married    Spouse name: Not on file   Number of children: Not on file   Years of education: Not on file   Highest education level: Not on file  Occupational History   Not on file  Social Needs   Financial resource strain: Not on  file   Food insecurity    Worry: Not on file    Inability: Not on file   Transportation needs    Medical: Not on file    Non-medical: Not on file  Tobacco Use   Smoking status: Current Every Day Smoker    Packs/day: 0.30    Types: Cigarettes   Smokeless tobacco: Never Used  Substance and Sexual Activity   Alcohol use: Yes    Alcohol/week: 2.0 - 3.0 standard drinks    Types: 2 - 3 Cans of beer per week   Drug use: No   Sexual activity: Not on file  Lifestyle   Physical activity    Days per week: Not on file    Minutes per session: Not on file   Stress: Not on file  Relationships   Social connections    Talks on phone: Not on file    Gets together: Not on file    Attends religious service: Not on file    Active member of club or organization: Not on file    Attends meetings of clubs or organizations: Not on file    Relationship status: Not on file   Intimate partner violence    Fear of current or ex partner: Not on file    Emotionally abused: Not on file    Physically abused: Not on file    Forced sexual activity: Not on file  Other Topics Concern   Not on file  Social History Narrative   Not on file    Family History  Problem Relation Age of Onset   Multiple sclerosis Mother    Diabetes Mother    Prostate cancer Father    Hypertension Father     No past medical history on file.  No past surgical history on file.  Current Outpatient Medications  Medication Sig Dispense Refill   cyclobenzaprine (FLEXERIL) 10 MG tablet Take 1 tablet (10 mg total) by mouth 3 (three) times daily as needed for muscle spasms. 30 tablet 0   diclofenac (VOLTAREN) 75 MG EC tablet Take 1 tablet (75 mg total) by mouth 2 (two) times daily. 50 tablet 2   No current facility-administered medications for this visit.     Allergies as of 10/21/2018 - Review Complete 10/21/2018  Allergen Reaction Noted   Peanut-containing drug products  12/27/2011    Vitals: BP  124/85    Pulse 82    Temp 98.7 F (37.1 C)    Ht 5\' 7"  (1.702 m)    Wt 202 lb (91.6 kg)    BMI 31.64 kg/m  Last Weight:  Wt Readings from Last 1 Encounters:  10/21/18 202 lb (91.6 kg)   Last Height:   Ht Readings from Last 1 Encounters:  10/21/18 5\' 7"  (1.702 m)    Physical exam:  General: The patient is awake, alert and  appears not in acute distress. The patient strongly smelling of smoke. Head: Normocephalic, atraumatic. Neck is supple. Cardiovascular:  Regular rate and rhythm, without  murmurs or carotid bruit, and without distended neck veins. Respiratory: Lungs are clear to auscultation. Skin:  Without evidence of edema, or rash Trunk:patient  has normal posture.  Neurologic exam : The patient is awake and alert, oriented to place and time.  Memory subjective  described as intact. There is a normal attention span & concentration ability. Speech is fluent with dysarthria, and he is not eloquent. Mood and affect are aloof .  Cranial nerves: Pupils are equal and briskly reactive to light. He wears strong corrective lenses. Funduscopic exam ; not possible - patient will need to see an ophthalmologist . Extraocular movements  in vertical and horizontal planes described above, left  over right ptosis.  Visual fields by finger perimetry are intact. Hearing to finger rub intact.   Facial sensation intact to fine touch. Facial motor strength is symmetric and tongue and uvula move midline. Tongue protrusion into either cheek is normal. Shoulder shrug is normal.   Motor exam:  Normal muscle bulk , but weakness of the grip.  Right over left dorsal flexion , weakness on the left. Hip flexion weakness on the left, adduction is strong, ab-duction weak.  Pronator drift left, and dysmetria on the right.  Sensory:  Hand and feet are now numb on both sides. Fine touch, pinprick and vibration were tested in all extremities. Hands feel numb and tingly, pin and needles.  Coordination: Rapid  alternating movements in the fingers/hands were slowed . Finger-to-nose maneuver   With evidence of  dysmetria on the right.  Gait and station: Patient walks without assistive device  and is able unassisted to walk- he wears very low hanging baggy pants that make the observation difficult.  Right foot outward pointing-  Limp- stooped, turning with 4-5 steps.   Stance is stable and normal.  Tandem gait deferred.. Romberg testing is positive.    Deep tendon reflexes: in the upper  extremities are symmetric and intact.  Lower extremity hyperreflexia on the left, versus normal on the right. Babinski maneuver response deffered.   Assessment:  After physical and neurologic examination, review of laboratory studies, imaging, neurophysiology testing and pre-existing records, assessment is that of :   Ataxic gait , abnormal eye movements, sensory and motor function loss, hyperreflexia but  no fasculations.   Beginning of symptoms Sept-Dec 2019 .   The demyelinating lesions are very suspicious for MS- and his Family history  is positive for MS in his mother. He denies incontinence, loss of taste or smell  Plan:  Treatment plan and additional workup :  MRI brain with and without contrast- B12 level.  CMET, Vit D, TSH,  LP under  fluoroscopy with CSF to be send oligoclonal bands, glucose, protein and cells, cultures. Blood for Serum comparison of CSF bands.      Asencion Partridge Thaer Miyoshi MD 10/21/2018

## 2018-10-21 NOTE — Patient Instructions (Signed)
Transverse Myelitis  Transverse myelitis is a condition that causes inflammation of the spinal cord. The inflammation affects the fatty lining that covers spinal cord nerves (myelin). It can cause scarring of nerves, which can interfere with nerve signals passing to and from the spinal cord. Signs and symptoms of this condition happen at the affected level of the spinal cord and below. The condition most often causes weakness of the arms or legs, pain, changes in feeling (sensation) in the arms or legs, and bowel and bladder problems. What are the causes? The exact cause of this condition is not known. It sometimes develops after a viral infection, such as herpes, chicken pox, cytomegalovirus, HIV, or Epstein-Barr virus. It can also occur after a bacterial infection or along with diseases that make the body's defense system (immune system) mistakenly attack healthy tissues (autoimmune diseases). What increases the risk? You are more likely to develop this condition if you have an autoimmune disease, such as multiple sclerosis, neuromyelitis optica, or lupus. What are the signs or symptoms? Symptoms of this condition may start suddenly within hours or develop gradually over weeks. Symptoms include:  Pain, especially in the neck, chest, or back, with shooting pains into the legs.  Weakness of the arms or the legs.  Difficulty walking, including foot dragging and stumbling.  Abnormal sensations, such as burning, prickling, numbness, or tingling, in the arms or legs.  Increased sensitivity to touch or changes in temperature.  Bowel and bladder problems, including an increased need to go, loss of control, and difficulty going to the bathroom. Other symptoms include:  Fatigue.  Fever.  Loss of appetite.  Headache.  Difficulty breathing.  Paralysis. How is this diagnosed? This condition may be diagnosed with a neurological exam. During this exam, your health care provider will ask about  your symptoms and do a complete physical exam to check your spinal cord function. You may need to see a nervous system specialist (neurologist) to have tests. Tests may include:  An MRI to check for inflammation or scarring in the spinal cord.  Blood tests to check for: ? Infections that can trigger this condition. ? Neuromyelitis optica.  A lumbar puncture to check your spinal fluid for signs of infection or inflammation. For this procedure, a small amount of the fluid that surrounds the brain and spinal cord is removed and examined. How is this treated? There is no cure for this condition. You may have treatment to reduce inflammation and manage symptoms. Often, treatment and monitoring are needed in the hospital setting. This condition may be treated with:  Pain medicine.  Corticosteroid medicines to reduce inflammation. These are usually given through an IV at first. Later, they may be taken by mouth.  Breathing support with a device called a respirator.  Physical therapy to: ? Reduce the risk of bedsores. ? Improve muscle strength, flexibility, coordination, and range of motion in affected muscles. ? Reduce muscle spasms and muscle wasting in paralyzed arms or legs. ? Improve control over your bladder and bowel.  Occupational therapy. This therapy helps you learn how to care for yourself and do everyday tasks such as bathing and dressing. It cannot reverse problems caused by this condition, but it can help you become as independent as possible. Follow these instructions at home:  Take over-the-counter and prescription medicines only as told by your health care provider.  Rest at home as told by your health care provider until you start to recover strength and movement. Ask your health care  provider what activities are safe for you.  Do exercises as told by your health care provider.  Keep all follow-up visits as told by your health care provider. This is important. Contact a  health care provider if:  Your symptoms are not improving or are getting worse.  You have symptoms that come back after going away.  You are having a hard time managing at home. Get help right away if you:  Cannot care for yourself at home.  Have trouble breathing. Summary  Transverse myelitis is a condition that causes inflammation of the spinal cord. Signs and symptoms of this condition happen at the affected level of the spinal cord and below.  There is no cure for this condition. You may have treatment in the hospital setting to reduce inflammation and manage symptoms.  Treatment may include pain medicine, corticosteroid medicines, breathing support with a respirator, physical therapy, and occupational therapy.  Rest at home as told by your health care provider until you start to recover strength and movement. Ask your health care provider what activities are safe for you.  Keep all follow-up visits as told by your health care provider. This is important. This information is not intended to replace advice given to you by your health care provider. Make sure you discuss any questions you have with your health care provider. Document Released: 02/28/2002 Document Revised: 02/04/2018 Document Reviewed: 02/04/2018 Elsevier Patient Education  2020 [employer].

## 2018-10-22 ENCOUNTER — Telehealth: Payer: Self-pay

## 2018-10-22 ENCOUNTER — Other Ambulatory Visit: Payer: Self-pay | Admitting: Nurse Practitioner

## 2018-10-22 DIAGNOSIS — R202 Paresthesia of skin: Secondary | ICD-10-CM

## 2018-10-22 DIAGNOSIS — D649 Anemia, unspecified: Secondary | ICD-10-CM

## 2018-10-22 NOTE — Telephone Encounter (Signed)
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   PCP to evaluate for Lupus, Lymphoma, and other autoimmune hemolytic disorders, toxicology of substances that can cause hemolysis.    We are awaiting brain MRI with and without contrast to work up possible CNS demyelination, with CSF analysis to follow.

## 2018-10-22 NOTE — Telephone Encounter (Signed)
CMA attempt to reach patient to inform he have additional labs and referrals.  Pt. Need to callback to schedule a lab appt.

## 2018-10-25 ENCOUNTER — Other Ambulatory Visit: Payer: Self-pay

## 2018-10-25 ENCOUNTER — Telehealth: Payer: Self-pay | Admitting: Neurology

## 2018-10-25 ENCOUNTER — Ambulatory Visit: Payer: BLUE CROSS/BLUE SHIELD | Attending: Nurse Practitioner

## 2018-10-25 DIAGNOSIS — D649 Anemia, unspecified: Secondary | ICD-10-CM

## 2018-10-25 DIAGNOSIS — R202 Paresthesia of skin: Secondary | ICD-10-CM

## 2018-10-25 LAB — COMPREHENSIVE METABOLIC PANEL
ALT: 16 IU/L (ref 0–44)
AST: 17 IU/L (ref 0–40)
Albumin/Globulin Ratio: 1.9 (ref 1.2–2.2)
Albumin: 4.7 g/dL (ref 4.0–5.0)
Alkaline Phosphatase: 43 IU/L (ref 39–117)
BUN/Creatinine Ratio: 13 (ref 9–20)
BUN: 12 mg/dL (ref 6–20)
Bilirubin Total: 0.9 mg/dL (ref 0.0–1.2)
CO2: 21 mmol/L (ref 20–29)
Calcium: 9.7 mg/dL (ref 8.7–10.2)
Chloride: 101 mmol/L (ref 96–106)
Creatinine, Ser: 0.96 mg/dL (ref 0.76–1.27)
GFR calc Af Amer: 115 mL/min/{1.73_m2} (ref >59)
GFR calc non Af Amer: 99 mL/min/{1.73_m2} (ref >59)
Globulin, Total: 2.5 g/dL (ref 1.5–4.5)
Glucose: 68 mg/dL (ref 65–99)
Potassium: 3.7 mmol/L (ref 3.5–5.2)
Sodium: 141 mmol/L (ref 134–144)
Total Protein: 7.2 g/dL (ref 6.0–8.5)

## 2018-10-25 LAB — TSH+FREE T4
Free T4: 1.29 ng/dL (ref 0.82–1.77)
TSH: 0.929 u[IU]/mL (ref 0.450–4.500)

## 2018-10-25 LAB — CBC WITH DIFFERENTIAL/PLATELET
Basophils Absolute: 0 10*3/uL (ref 0.0–0.2)
Basos: 1 %
EOS (ABSOLUTE): 0.1 10*3/uL (ref 0.0–0.4)
Eos: 1 %
Hematocrit: 34 % — ABNORMAL LOW (ref 37.5–51.0)
Hemoglobin: 12.2 g/dL — ABNORMAL LOW (ref 13.0–17.7)
Immature Grans (Abs): 0 10*3/uL (ref 0.0–0.1)
Immature Granulocytes: 0 %
Lymphocytes Absolute: 2.6 10*3/uL (ref 0.7–3.1)
Lymphs: 32 %
MCH: 32 pg (ref 26.6–33.0)
MCHC: 35.9 g/dL — ABNORMAL HIGH (ref 31.5–35.7)
MCV: 89 fL (ref 79–97)
Monocytes Absolute: 0.6 10*3/uL (ref 0.1–0.9)
Monocytes: 8 %
Neutrophils Absolute: 4.9 10*3/uL (ref 1.4–7.0)
Neutrophils: 58 %
Platelets: 270 10*3/uL (ref 150–450)
RBC: 3.81 x10E6/uL — ABNORMAL LOW (ref 4.14–5.80)
RDW: 11.8 % (ref 11.6–15.4)
WBC: 8.3 10*3/uL (ref 3.4–10.8)

## 2018-10-25 LAB — VITAMIN B12: Vitamin B-12: 1010 pg/mL (ref 232–1245)

## 2018-10-25 LAB — NEUROMYELITIS OPTICA AUTOAB, IGG: NMO IgG Autoantibodies: <1.5 U/mL (ref 0.0–3.0)

## 2018-10-25 NOTE — Telephone Encounter (Signed)
[employer] Auth: [id number] (exp. 10/25/18 to 04/22/19) patient is scheduled at GI for 11/16/18.

## 2018-10-26 ENCOUNTER — Telehealth: Payer: Self-pay | Admitting: Neurology

## 2018-10-26 NOTE — Telephone Encounter (Signed)
Called the pt and reviewed the lab results with him. Patient completed some of the lab work through PCP yesterday. Advised that once that comes back they should be in contact with him. Advised that the other lab work looked good. Informed him that the PCP was ruling out some autoimmune  Lab work and that once we have the MRI of the brain we will be able to assess things in detail. Informed him that the MRI coordinator has left for the day but that she will reach out to him about making sure the MRI is scheduled somewhere within his insurance network. Pt verbalized understanding. Pt had no questions at this time but was encouraged to call back if questions arise.   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   PCP to evaluate for Lupus, Lymphoma, and other autoimmune hemolytic disorders, toxicology of substances that can cause hemolysis.    We are awaiting brain MRI with and without contrast to work up possible CNS demyelination, with CSF analysis to follow."

## 2018-10-26 NOTE — Telephone Encounter (Signed)
Pt called wanting to know if he can be referred to someone under his insurance network for the MRI and also he would like to know if the lab results have come in. Please advise.

## 2018-10-27 LAB — ANA,IFA RA DIAG PNL W/RFLX TIT/PATN
ANA Titer 1: NEGATIVE
Cyclic Citrullin Peptide Ab: 6 units (ref 0–19)
Rheumatoid fact SerPl-aCnc: <10 IU/mL (ref 0.0–13.9)

## 2018-10-27 LAB — HAPTOGLOBIN: Haptoglobin: 128 mg/dL (ref 17–317)

## 2018-10-28 NOTE — Telephone Encounter (Signed)
I spoke to the patient and informed him I was faxing the order to [facility] in [place] he is aware that his insurance is in his network. I also gave him their number of [phone] to give them a call if he hasnt heard from them in the next couple days.

## 2018-10-29 NOTE — Telephone Encounter (Signed)
Patient came and got labs done on 10/25/2018.

## 2018-11-03 ENCOUNTER — Telehealth: Payer: Self-pay | Admitting: Nurse Practitioner

## 2018-11-03 NOTE — Telephone Encounter (Signed)
New Message   Pt calling to check on the status of his lab results. Please f/u

## 2018-11-03 NOTE — Telephone Encounter (Signed)
CMA spoke to patient and inform on results.  Pt. Understood.

## 2018-11-05 ENCOUNTER — Other Ambulatory Visit: Payer: Self-pay

## 2018-11-05 ENCOUNTER — Encounter (HOSPITAL_COMMUNITY): Payer: Self-pay

## 2018-11-05 DIAGNOSIS — R200 Anesthesia of skin: Secondary | ICD-10-CM | POA: Insufficient documentation

## 2018-11-05 DIAGNOSIS — R262 Difficulty in walking, not elsewhere classified: Secondary | ICD-10-CM | POA: Insufficient documentation

## 2018-11-05 DIAGNOSIS — F17210 Nicotine dependence, cigarettes, uncomplicated: Secondary | ICD-10-CM | POA: Insufficient documentation

## 2018-11-05 NOTE — ED Triage Notes (Signed)
Pt reports bilateral hand numbness and tingling x 2-3 weeks. Also reports that he feels off balance and that his L leg feels heavy. Pt ambulatory. He states that he has an MRI scheduled, but feels that he cannot wait for his appointment. No facial droop, no slurred speech, or confusion.

## 2018-11-06 ENCOUNTER — Emergency Department (HOSPITAL_COMMUNITY)
Admission: EM | Admit: 2018-11-06 | Discharge: 2018-11-06 | Disposition: A | Discharge status: Home / Self Care | Payer: BLUE CROSS/BLUE SHIELD | Attending: Emergency Medicine | Admitting: Emergency Medicine

## 2018-11-06 DIAGNOSIS — R200 Anesthesia of skin: Secondary | ICD-10-CM

## 2018-11-06 NOTE — ED Notes (Addendum)
Call for vitals update; no answer x1. [facility] ADDENDUM: Pt found asleep in lobby...vitals updated. [employer]

## 2018-11-06 NOTE — ED Provider Notes (Signed)
[facility] DEPT Provider Note   CSN: [id number] Arrival date & time: 11/05/18  2158     History   Chief Complaint Chief Complaint  Patient presents with  . Numbness    HPI JEVAN GAUNT is a 38 y.o. male.     The history is provided by the patient.  Neurologic Problem This is a chronic problem. The current episode started more than 1 week ago. The problem occurs daily. The problem has been gradually worsening. Pertinent negatives include no chest pain, no abdominal pain, no headaches and no shortness of breath. The symptoms are aggravated by walking. The symptoms are relieved by rest.   Patient presents with numbness & difficulty walking. Patient has had symptoms ongoing for quite some time.  He recently had a neurology evaluation and is undergoing a work-up for multiple sclerosis. He has a family history of multiple sclerosis. He has had numbness in his hands and feet and progressively worsening.  Tonight when he was trying to stand he felt off balance and came to be evaluated.  No fevers or vomiting.  No new headache, no new visual changes.  He is to undergo an MRI and lumbar puncture this month   Patient Active Problem List   Diagnosis Date Noted  . 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 Weakness of both lower extremities 10/21/2018  . Cervical myelopathy ([place]) 10/21/2018    History reviewed. No pertinent surgical history.      Home Medications    Prior to Admission medications   Medication Sig Start Date End Date Taking? Authorizing Provider  cyclobenzaprine (FLEXERIL) 10 MG tablet Take 1 tablet (10 mg total) by mouth 3 (three) times daily as needed for muscle spasms. Patient not taking: Reported on 11/06/2018 07/12/18   Wallene Huh, DPM  diclofenac (VOLTAREN) 75 MG EC tablet Take 1 tablet (75 mg total) by mouth 2 (two) times daily. Patient not taking:  Reported on 11/06/2018 07/12/18   Wallene Huh, DPM    Family History Family History  Problem Relation Age of Onset  . Multiple sclerosis Mother   . Diabetes Mother   . Prostate cancer Father   . Hypertension Father     Social History Social History   Tobacco Use  . Smoking status: Current Every Day Smoker    Packs/day: 0.30    Types: Cigarettes  . Smokeless tobacco: Never Used  Substance Use Topics  . Alcohol use: Yes    Alcohol/week: 2.0 - 3.0 standard drinks    Types: 2 - 3 Cans of beer per week  . Drug use: No     Allergies   Peanut-containing drug products   Review of Systems Review of Systems  Constitutional: Negative for fever.  Eyes: Negative for visual disturbance.  Respiratory: Negative for shortness of breath.   Cardiovascular: Negative for chest pain.  Gastrointestinal: Negative for abdominal pain.  Neurological: Positive for weakness and numbness. Negative for syncope, speech difficulty and headaches.  Psychiatric/Behavioral: Negative for confusion.  All other systems reviewed and are negative.    Physical Exam Updated Vital Signs BP (!) 117/92 (BP Location: Left Arm)   Pulse 60   Temp 98.1 F (36.7 C) (Oral)   Resp 15   SpO2 100%   Physical Exam CONSTITUTIONAL: Well developed/well nourished HEAD: Normocephalic/atraumatic EYES: EOMI/PERRL, no nystagmus ENMT: Mucous membranes moist NECK: supple no meningeal signs  CV: S1/S2 noted, no  murmurs/rubs/gallops noted LUNGS: Lungs are clear to auscultation bilaterally, no apparent distress ABDOMEN: soft, nontender  NEURO:Awake/alert, face symmetric, no arm or leg drift is noted Equal 5/5 strength with equal hand grips bilaterally Gait is slow but no ataxia No past pointing Reports numbness to bilateral hands/feet EXTREMITIES: pulses normal, full ROM SKIN: warm, color normal PSYCH: no abnormalities of mood noted   ED Treatments / Results  Labs (all labs ordered are listed, but only  abnormal results are displayed) Labs Reviewed - No data to display  EKG None  Radiology No results found.  Procedures Procedures   Medications Ordered in ED Medications - No data to display   Initial Impression / Assessment and Plan / ED Course  I have reviewed the triage vital signs and the nursing notes. 5:54 AM Patient presents for neurologic evaluation.  He has had gradually worsening neurologic symptoms is undergoing evaluation by neurology Dr. Brett Fairy.  He reports numbness in his hands and feet, and now is having some difficulty with balance.  No falls or syncope   Here in the ER he is able to ambulate with a slow gait.  No gross motor deficits, but does have numbness in his hands and feet. Denies any acute visual changes. At this point I feel his work-up should continue as an outpatient, no indication for emergent MRI 6:16 AM I had a long discussion with patient He wasn't entirely sure that he was being evaluated for MS We had a discussion about is upcoming testing At this time there is no indication for emergent testing He was given work note for now in case he needs it since his symptoms are progressing Pt agreeable with plan  Final Clinical Impressions(s) / ED Diagnoses   Final diagnoses:  Numbness    ED Discharge Orders    None       Ripley Fraise, MD 11/06/18 [phone]

## 2018-11-08 ENCOUNTER — Telehealth: Payer: Self-pay | Admitting: Neurology

## 2018-11-08 NOTE — Telephone Encounter (Signed)
Patient would like to get his LP done somewhere that is in his network he states GI is not in his network.    Also, he was wondering if there is anything that Dr. Brett Fairy can prescribe him for his numbness and balance issues.

## 2018-11-08 NOTE — Telephone Encounter (Addendum)
At this time the patient needs to have the complete testing with his MRI and LP before can provide anything for numbness and balance concerns. We needs to get the testing completed first. Please make sure that the MRI results and LP results are sent to [place] AS soon as they have been completed so that we can provide a plan of care for the patient.

## 2018-11-09 ENCOUNTER — Telehealth: Payer: Self-pay | Admitting: Neurology

## 2018-11-09 NOTE — Telephone Encounter (Signed)
Pt in lobby requesting pain meds for hands and back. Needs referral in network for lumbar puncture. He can wait a few min in lobby r call  Best call back [phone]

## 2018-11-11 NOTE — Telephone Encounter (Signed)
Patient is scheduled for LP at [facility] 11/22/2018 arrive at 8:00 am NPO after 12:00 Midnight.  [place].  Telephone [phone] - fax [phone].  I have talked to patient he is aware of all details of his apt.

## 2018-11-11 NOTE — Telephone Encounter (Signed)
Patient scheduled.

## 2018-11-12 ENCOUNTER — Telehealth: Payer: Self-pay | Admitting: Neurology

## 2018-11-12 DIAGNOSIS — R449 Unspecified symptoms and signs involving general sensations and perceptions: Secondary | ICD-10-CM

## 2018-11-12 DIAGNOSIS — D352 Benign neoplasm of pituitary gland: Secondary | ICD-10-CM

## 2018-11-12 DIAGNOSIS — G379 Demyelinating disease of central nervous system, unspecified: Secondary | ICD-10-CM

## 2018-11-12 NOTE — Telephone Encounter (Signed)
Pt called wanting his MRI results. Pt states he is having intense numbness in hands and legs. L leg is hard to walk on. Please advise.

## 2018-11-15 ENCOUNTER — Encounter: Payer: Self-pay | Admitting: Neurology

## 2018-11-15 ENCOUNTER — Other Ambulatory Visit: Payer: Self-pay | Admitting: Neurology

## 2018-11-15 ENCOUNTER — Telehealth: Payer: Self-pay | Admitting: Neurology

## 2018-11-15 ENCOUNTER — Ambulatory Visit (INDEPENDENT_AMBULATORY_CARE_PROVIDER_SITE_OTHER): Payer: BLUE CROSS/BLUE SHIELD | Admitting: Neurology

## 2018-11-15 ENCOUNTER — Other Ambulatory Visit: Payer: Self-pay

## 2018-11-15 DIAGNOSIS — G379 Demyelinating disease of central nervous system, unspecified: Secondary | ICD-10-CM

## 2018-11-15 DIAGNOSIS — R29898 Other symptoms and signs involving the musculoskeletal system: Secondary | ICD-10-CM

## 2018-11-15 DIAGNOSIS — Z79899 Other long term (current) drug therapy: Secondary | ICD-10-CM

## 2018-11-15 DIAGNOSIS — D352 Benign neoplasm of pituitary gland: Secondary | ICD-10-CM | POA: Diagnosis not present

## 2018-11-15 DIAGNOSIS — G959 Disease of spinal cord, unspecified: Secondary | ICD-10-CM

## 2018-11-15 NOTE — Patient Instructions (Signed)
Pituitary Tumors  Pituitary tumors are abnormal growths found in the pituitary gland. The pituitary gland is a small organ in the center of the brain. It makes hormones that affect growth and the functions of other glands in the body. In most cases, pituitary tumors grow slowly, are not cancerous (benign), and do not spread to other parts of the body. These tumors are best treated when they are found and diagnosed early. A pituitary tumor may produce hormones (functioning tumor) or not (non-functioning tumor). A pituitary tumor may cause:  Cushing disease. In this disease, the pituitary gland produces too much of a hormone called cortisol. This causes fat to build up in the face, back, and chest while the arms and legs become thin.  Acromegaly. This is a condition in which the hands, feet, and face are larger than normal.  Breast milk production, even when there is no pregnancy. What are the causes? The cause of most pituitary tumors is not known. In some cases, pituitary tumors may be passed from parent to child (inherited). What increases the risk? You are more likely to develop this condition if:  You have a family history of pituitary tumors.  You have certain syndromes caused by unwanted changes (mutations) in your genes. What are the signs or symptoms? Symptoms of this condition include:  Headaches.  Loss of consciousness.  Vision problems and eye muscle weakness.  Weakness or low energy.  Clear fluid draining from the nose.  Changes in the sense of smell.  Loss of body hair.  Nausea and vomiting.  Problems caused by the production of too many hormones, such as: ? Inability to get pregnant after a year of having sex regularly without using birth control (infertility). ? Loss of menstrual periods in women. ? Abnormal growth. ? Diabetes insipidus or diabetes mellitus. ? High blood pressure (hypertension). ? Inability to tolerate heat or cold. ? Increase in  sweating. ? Joint pain. ? Other skin and body changes. ? Nipple discharge. ? Changes in mood, or depression. ? Decreased sexual function. How is this diagnosed? This condition may be diagnosed based on:  Your symptoms.  Your medical history.  Blood or urine tests to check your hormone levels.  CT scan.  MRI.  Removal and examination of a small tumor tissue (biopsy). How is this treated? Treatment depends on the type of pituitary tumor you have and your overall health. Treatments may include:  Surgical removal of the tumor.  Using high doses of X-ray energy to kill tumor cells (radiation).  Using certain medicines to stop the pituitary gland from producing too many hormones (drug therapy). If you have a family history of pituitary tumors, you may need to have regular blood tests to monitor pituitary hormone levels. Follow these instructions at home:  Drink enough fluid to keep your urine clear or pale yellow.  If directed, follow instructions from your health care provider about measuring how much urine you pass.  Do not pick your nose or remove any crusting, if your nose is draining clear fluid. Tell your health care provider if this condition worsens.  Do not do any activities that require straining, such as heavy lifting. Ask your health care provider what activities are safe for you.  Take over-the-counter and prescription medicines only as told by your health care provider.  Keep all follow-up visits as told by your health care provider. This is important, especially if you have a family history of pituitary tumors and you need regular blood tests.  Contact a health care provider if:  You have sudden, unusual thirst.  You are urinating more often than usual.  You have a headache that does not go away.  You develop new changes in your vision.  You have clear fluid leaking from your nose or ears.  You have a sensation of fluid trickling down the back of your  throat.  You have a salty taste in your mouth.  You have trouble concentrating. Get help right away if:  Your symptoms suddenly become severe.  You have a nosebleed that does not stop after a few minutes.  You have a fever of over 101F (38.3C).  You have a severe headache.  You have a stiff neck.  You are confused or not as alert as usual.  You have chest pain.  You have shortness of breath. Summary  Pituitary tumors are abnormal growths found in the pituitary gland.  Treatment depends on the type of pituitary tumor you have and your overall health.  Keep all follow-up visits as told by your health care provider. This is important, especially if you have a family history of pituitary tumors and you need regular blood tests. This information is not intended to replace advice given to you by your health care provider. Make sure you discuss any questions you have with your health care provider. Document Released: 02/28/2002 Document Revised: 07/01/2018 Document Reviewed: 05/27/2016 Elsevier Patient Education  [place]. Multiple Sclerosis Multiple sclerosis (MS) is a disease of the brain, spinal cord, and optic nerves (central nervous system). It causes the body's disease-fighting (immune) system to destroy the protective covering (myelin sheath) around nerves in the brain. When this happens, signals (nerve impulses) going to and from the brain and spinal cord do not get sent properly or may not get sent at all. There are several types of MS:  Relapsing-remitting MS. This is the most common type. This causes sudden attacks of symptoms. After an attack, you may recover completely until the next attack, or some symptoms may remain permanently.  Secondary progressive MS. This usually develops after the onset of relapsing-remitting MS. Similar to relapsing-remitting MS, this type also causes sudden attacks of symptoms. Attacks may be less frequent, but symptoms slowly get  worse (progress) over time.  Primary progressive MS. This causes symptoms that steadily progress over time. This type of MS does not cause sudden attacks of symptoms. The age of onset of MS varies, but it often develops between 29-66 years of age. MS is a lifelong (chronic) condition. There is no cure, but treatment can help slow down the progression of the disease. What are the causes? The cause of this condition is not known. What increases the risk? You are more likely to develop this condition if:  You are a woman.  You have a relative with MS. However, the condition is not passed from parent to child (inherited).  You have a lack (deficiency) of vitamin D.  You smoke. MS is more common in the [place] than in the [place]. What are the signs or symptoms? Relapsing-remitting and secondary progressive MS cause symptoms to occur in episodes or attacks that may last weeks to months. There may be long periods between attacks in which there are almost no symptoms. Primary progressive MS causes symptoms to steadily progress after they develop. Symptoms of MS vary because of the many different ways it affects the central nervous system. The main symptoms include:  Vision problems and eye pain.  Numbness.  Weakness.  Inability to move your arms, hands, feet, or legs (paralysis).  Balance problems.  Shaking that you cannot control (tremors).  Muscle spasms.  Problems with thinking (cognitive changes). MS can also cause symptoms that are associated with the disease, but are not always the direct result of an MS attack. They may include:  Inability to control urination or bowel movements (incontinence).  Headaches.  Fatigue.  Inability to tolerate heat.  Emotional changes.  Depression.  Pain. How is this diagnosed? This condition is diagnosed based on:  Your symptoms.  A neurological exam. This involves checking central nervous system  function, such as nerve function, reflexes, and coordination.  MRIs of the brain and spinal cord.  Lab tests, including a lumbar puncture that tests the fluid that surrounds the brain and spinal cord (cerebrospinal fluid).  Tests to measure the electrical activity of the brain in response to stimulation (evoked potentials). How is this treated? There is no cure for MS, but medicines can help decrease the number and frequency of attacks and help relieve nuisance symptoms. Treatment options may include:  Medicines that reduce the frequency of attacks. These medicines may be given by injection, by mouth (orally), or through an IV.  Medicines that reduce inflammation (steroids). These may provide short-term relief of symptoms.  Medicines to help control pain, depression, fatigue, or incontinence.  Vitamin D, if you have a deficiency.  Using devices to help you move around (assistive devices), such as braces, a cane, or a walker.  Physical therapy to strengthen and stretch your muscles.  Occupational therapy to help you with everyday tasks.  Alternative or complementary treatments such as exercise, massage, or acupuncture. Follow these instructions at home:  Take over-the-counter and prescription medicines only as told by your health care provider.  Do not drive or use heavy machinery while taking prescription pain medicine.  Use assistive devices as recommended by your physical therapist or your health care provider.  Exercise as directed by your health care provider.  Return to your normal activities as told by your health care provider. Ask your health care provider what activities are safe for you.  Reach out for support. Share your feelings with friends, family, or a support group.  Keep all follow-up visits as told by your health care provider and therapists. This is important. Where to find more information  National Multiple Sclerosis Society:  https://www.nationalmssociety.org Contact a health care provider if:  You feel depressed.  You develop new pain or numbness.  You have tremors.  You have problems with sexual function. Get help right away if:  You develop paralysis.  You develop numbness.  You have problems with your bladder or bowel function.  You develop double vision.  You lose vision in one or both eyes.  You develop suicidal thoughts.  You develop severe confusion. If you ever feel like you may hurt yourself or others, or have thoughts about taking your own life, get help right away. You can go to your nearest emergency department or call:  Your local emergency services (911 in the [place]  A suicide crisis helpline, such as the [place] at 440-643-7403. This is open 24 hours a day. Summary  Multiple sclerosis (MS) is a disease of the central nervous system that causes the body's immune system to destroy the protective covering (myelin sheath) around nerves in the brain.  There are 3 types of MS: relapsing-remitting, secondary progressive, and primary progressive. Relapsing-remitting and secondary progressive MS  cause symptoms to occur in episodes or attacks that may last weeks to months. Primary progressive MS causes symptoms to steadily progress after they develop.  There is no cure for MS, but medicines can help decrease the number and frequency of attacks and help relieve nuisance symptoms. Treatment may also include physical or occupational therapy.  If you develop numbness, paralysis, vision problems, or other neurological symptoms, get help right away. This information is not intended to replace advice given to you by your health care provider. Make sure you discuss any questions you have with your health care provider. Document Released: 03/07/2000 Document Revised: 02/20/2017 Document Reviewed: 05/19/2016 Elsevier Patient Education  2020 [employer].

## 2018-11-15 NOTE — Progress Notes (Signed)
Provider:  Larey Seat, M D  Referring Provider: No ref. provider found Primary Care Physician:  Patient, No Pcp Per  Chief Complaint  Patient presents with   Follow-up    rm 11, pt here to discuss MRI results and treatment plan    RV : 11-15-2018,  I have today the opportunity to meet with Mr Aboud and his wife.  As noted after our consultation visit , Mr. Bradburn had a suspicious, likely demyelination based lesion in his cervical cord.   Mr. Haberman underwent the gated MRI of the brain with and without contrast he needed to go to [facility].  The baseline symptom had been hand numbness for 3 weeks but he also was overall ataxic and uncoordinated.  The study was performed on 11 November 2018 at 4:15 PM showed normal brain volume no atrophy of brain tissue but at least 10 abnormal T2 and flair hyperintensity lesions in the periventricular white matter.  These involve typical locations for acute multiple sclerosis.  The cerebellum appears unaffected aside from a tiny right-sided lesion.  There was a pontine demyelinating lesion.  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  Based on these findings I think that the diagnosis of multiple sclerosis was made.  The patient will have oligoclonal bands drawn, in addition to be added today several laboratory tests to define which intensive intravenous sleep treatment option should be used.  I do the JC virus panel, hepatitis, tuberculosis, varicella-zoster, as well as several stimulating hormones that are controlled by the pituitary gland and a prolactin level.  Dyann Ruddle to what is Tysabri or Ocrevus the treatment of this young man.  I also will send him to a neurosurgeon in the near future to evaluate the microadenoma which can usually be transnasally resected.  He will need a visual field test to make sure  how much if any visual field has been restricted.  We have met today to sign the treatment agreements for MS type Ocrevus and Tysabri.   He is working in an Designer, television/film set and is affected by hand clumsiness and balance problems. He feels at risk of dropping objects. He noted his feet burning, sometimes feeling numb.  Left leg is weaker , left arm is worse affected than right.       HPI:  RONALDO CRILLY is a 38 y.o. male  Is seen here as a referral  from NP  No ref. provider found for follow up on recently obtained MRI lumbar and cervical spine.  Patient was referred to [facility] , but could never reach their office by phone and was never invited to a consultation.   I had the pleasure of meeting Mr. Justiniano today who is a right-handed African-American gentleman age 60 and has last seen his referring nurse practitioner Mrs. Raul Del on 07 July 2018.  This was a virtual visit via via telephone.  The patient had continued to persist with complaints of right leg paresthesia and left arm weakness.  The onset was at the beginning of the year.  A lumbar x-ray was first obtained showing only mild compression deformity and very mild degenerative disc disease not explaining the patient's symptoms.  He was referred to neurosurgery for both the muscle weakness of the left arm and the paresthesia of the right foot and asked to return on 06 October 2018  for a visit.  I have the second note here from podiatrist Ila Mcgill obtained on 07-12-2018.  He prescribed an anti-inflammatory diclofenac and also recommended Flexeril to help with muscle spasms.  He mentioned that neurovascular status was intact, he has some flatfoot deformity bilaterally and was suspected to have a tendinitis.  The patient finally underwent an MRI of the lumbar spine and cervical spine both without contrast.  The studies were obtained on 06 Aug 2018 the lumbar spine showed no fracture, mild or minimal degenerative changes no suspicious  lesions just minor osteophytes which is meeting bone spurs.  No significant stenosis.  Mild bilateral neural foraminal stenosis at the expected L5-S1 region.  And a really mild disc bulge.  No spinal stenosis that would justify or explain the patient's symptoms.  As to the cervical spine MRI also obtained without contrast on 15 May there is no major bony abnormality either, the cord shows T2 hyperintense lesions in the left aspect of the cord extending from C5-6 C6 about 2 cm in length and estimates similar lesion is found lower towards the right anterior aspect of the cord with a length of 1.8 cm spanning from C6 to see 7.  Smaller lesions were noticed left-sided at C4 and on the right at C4 and 5 and this seems to be a mild cord expansion associated with the left-sided lesion.  There was only one region of moderate right-sided spinal stenosis at C5-C6 this could explain some of the arm pain or weakness.  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  Mr. Odella Aquas. Rosenow has advised me that his mother has multiple sclerosis and he had wondered if this disease could affect him.    Social history- no regular work out since 2019, smoker 10 cigarettes, Caffeine- soda daily.  Alcohol; 4-5 a week.  He is working - as daytime assembly line work.   he sometimes unloads trucks.    Review of Systems: Out of a complete 14 system review, the patient complains of only the following symptoms, and all other reviewed systems are negative.  He reports difficulties with weight lifting, left arm and hand, loss of grip strength.   Right leg tingling- now affecting both legs. Pins and needles,   Left eye ptosis and abnormal eye movements, left eye shut when looking to the left.   Social History   Socioeconomic History   Marital status: Married    Spouse name: Not on file   Number of children: Not on file   Years of education: Not on file   Highest education level: Not on file   Occupational History   Not on file  Social Needs   Financial resource strain: Not on file   Food insecurity    Worry: Not on file    Inability: Not on file   Transportation needs    Medical: Not on file    Non-medical: Not on file  Tobacco Use   Smoking status: Current Every Day Smoker    Packs/day: 0.30    Types: Cigarettes   Smokeless tobacco: Never Used  Substance and Sexual Activity   Alcohol use: Yes    Alcohol/week: 2.0 - 3.0 standard drinks    Types: 2 - 3 Cans of beer per week   Drug use: No   Sexual activity: Not on file  Lifestyle   Physical activity    Days per week: Not on file    Minutes per session: Not on file  Stress: Not on file  Relationships   Social connections    Talks on phone: Not on file    Gets together: Not on file    Attends religious service: Not on file    Active member of club or organization: Not on file    Attends meetings of clubs or organizations: Not on file    Relationship status: Not on file   Intimate partner violence    Fear of current or ex partner: Not on file    Emotionally abused: Not on file    Physically abused: Not on file    Forced sexual activity: Not on file  Other Topics Concern   Not on file  Social History Narrative   Not on file    Family History  Problem Relation Age of Onset   Multiple sclerosis Mother    Diabetes Mother    Prostate cancer Father    Hypertension Father     No past medical history on file.  No past surgical history on file.  No current outpatient medications on file.   No current facility-administered medications for this visit.     Allergies as of 11/15/2018 - Review Complete 11/15/2018  Allergen Reaction Noted   Peanut-containing drug products Swelling 12/27/2011    Vitals: There were no vitals taken for this visit. Last Weight:  Wt Readings from Last 1 Encounters:  10/21/18 202 lb (91.6 kg)   Last Height:   Ht Readings from Last 1 Encounters:    10/21/18 '5\' 7"'$  (1.702 m)    Physical exam:  General: The patient is awake, alert and appears not in acute distress. The patient strongly smelling of smoke. Head: Normocephalic, atraumatic. Neck is supple. Cardiovascular:  Regular rate and rhythm, without  murmurs or carotid bruit, and without distended neck veins. Respiratory: Lungs are clear to auscultation. Skin:  Without evidence of edema, or rash Trunk:patient  has normal posture.  Neurologic exam : The patient is awake and alert, oriented to place and time.  Memory subjective  described as intact. There is a normal attention span & concentration ability. Speech is fluent with dysarthria, and he is not eloquent. Mood and affect are aloof .  Cranial nerves: Pupils are equal and briskly reactive to light. He wears strong corrective lenses. Funduscopic exam ; not possible - patient will need to see an ophthalmologist . Extraocular movements  in vertical and horizontal planes described above, left  over right ptosis.  Visual fields by finger perimetry are intact. Hearing to finger rub intact.   Facial sensation intact to fine touch. Facial motor strength is symmetric and tongue and uvula move midline. Tongue protrusion into either cheek is normal. Shoulder shrug is normal.   Motor exam:  Normal muscle bulk , but weakness of the grip.  Right over left dorsal flexion , weakness on the left. Hip flexion weakness on the left, adduction is strong, ab-duction weak.  Pronator drift left, and dysmetria on the right.  Sensory:  Hand and feet are now numb on both sides. Fine touch, pinprick and vibration were tested in all extremities. Hands feel numb and tingly, pin and needles.  Coordination: Rapid alternating movements in the fingers/hands were slowed . Finger-to-nose maneuver   With evidence of  dysmetria on the right.  Gait and station: Patient walks without assistive device  and is able unassisted to walk- he wears very low hanging baggy pants  that make the observation difficult.  Right foot outward pointing-  Limp- stooped, turning  with 4-5 steps.   Stance is stable and normal.  Tandem gait deferred.. Romberg testing is positive.    Deep tendon reflexes: in the upper  extremities are symmetric and intact.  Lower extremity hyperreflexia on the left, versus normal on the right. Babinski maneuver response deffered.   Assessment:  After physical and neurologic examination, review of laboratory studies, imaging, neurophysiology testing and pre-existing records, assessment is that of :   Ataxic gait , abnormal eye movements, sensory and motor function loss, hyperreflexia but  no fasculations.  Beginning of symptoms Sept-Dec 2019 .  The demyelinating lesions are very suspicious for MS- and his Family history  is positive for MS in his mother. He denies incontinence, loss of taste or smell. 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   Plan:  Treatment plan and additional workup :  We will decide between Ocrevus and Tysabri, depending on lab tests.  patient also will have pituitary adenoma evaluation with visual field testing and neurosurgery evaluation.  Rv in 4-6 weeks with me or Np.   Asencion Partridge Tziporah Knoke MD 11/15/2018

## 2018-11-15 NOTE — Addendum Note (Signed)
Addended by: Inis Sizer D on: 11/15/2018 03:38 PM   Modules accepted: Orders

## 2018-11-15 NOTE — Telephone Encounter (Signed)
Called the patient and spoke in detail with him in regards to his MRI findings. Advised the patient what the MRI findings showed. Advised that with that we would want to start treatment as soon as confirmation from LP shows. Advised the patient we would need lab work as well as signing of forms. Pt verbalized understanding. He will come to the office to work on completing this and get the labs completed.

## 2018-11-15 NOTE — Addendum Note (Signed)
Addended by: Darleen Crocker on: 11/15/2018 11:22 AM   Modules accepted: Orders

## 2018-11-15 NOTE — Telephone Encounter (Signed)
Dear Mr. Kroenke.   the most recent MRI of the brain with contrast did indeed show multiple lesions most consistent with MS.  There a several acute  Lesions, indicating a more aggressive form of MS .  Your cervical spine MRI had already indicated lesions.   I am planning a more aggressive treatment by infusion therapy and spoke to our [place] specialist , Dr. Felecia Shelling, who recommended also Tysabri or Ocrevus infusions.   In order to start you right after your LP is done and tested for oligoclonal bands, we will add a blood test for JCVirus , and can start with Tysabri soon after.  You just got an MRI with contrast and we have a CMET/ CBC count from there.    Tysabri  National Multiple Sclerosis [email or web address] > Treating-MS > Medications This medication is given by IV infusion. Description. Tysabri is a laboratory-produced monoclonal antibody.

## 2018-11-15 NOTE — Addendum Note (Signed)
Addended by: Inis Sizer D on: 11/15/2018 03:50 PM   Modules accepted: Orders

## 2018-11-16 ENCOUNTER — Telehealth: Payer: Self-pay | Admitting: Neurology

## 2018-11-16 ENCOUNTER — Other Ambulatory Visit: Payer: BLUE CROSS/BLUE SHIELD

## 2018-11-16 NOTE — Telephone Encounter (Signed)
I had to send patient to [facility] because patient can only use  [facility] doctors .  Telephone [phone] . Fax [phone] .

## 2018-11-17 ENCOUNTER — Inpatient Hospital Stay
Admission: RE | Admit: 2018-11-17 | Discharge: 2018-11-17 | Disposition: A | Discharge status: Home / Self Care | Payer: BLUE CROSS/BLUE SHIELD | Source: Ambulatory Visit | Attending: Neurology | Admitting: Neurology

## 2018-11-17 NOTE — Discharge Instructions (Signed)

## 2018-11-21 NOTE — Progress Notes (Signed)
CSF oligoclonal bands , protein and cell diff still pending ? CD

## 2018-11-22 ENCOUNTER — Telehealth: Payer: Self-pay | Admitting: *Deleted

## 2018-11-22 NOTE — Telephone Encounter (Signed)
Dr. Brett Fairy- does he need to come in to sign Ocrevus start form or did he sign once when he was here on 11/15/18?

## 2018-11-22 NOTE — Telephone Encounter (Signed)
Myriam Jacobson had him sign Tysabri and Ocrevus and kept the papers at her desk.   He can start ASAP.

## 2018-11-22 NOTE — Telephone Encounter (Signed)
Received faxed results from [place] that [place] ab collected on 11/15/18 positive, index: 0.54

## 2018-11-22 NOTE — Telephone Encounter (Signed)
JCV titer at 0.5 is positive - enough to change from tysabri to ocrevus.

## 2018-11-23 ENCOUNTER — Telehealth: Payer: Self-pay | Admitting: *Deleted

## 2018-11-23 ENCOUNTER — Telehealth: Payer: Self-pay

## 2018-11-23 DIAGNOSIS — Z0289 Encounter for other administrative examinations: Secondary | ICD-10-CM

## 2018-11-23 NOTE — Telephone Encounter (Signed)
Gave completed/signed Ocrevus start form, OV notes, insurance cards, infusion order form, MRI results, lab results to intrafusion to process for pt. Faxed completed/signed Ocrevus start form to [employer]. Received fax confirmation. Also sent copy of start form and infusion suite order form to be scanned to epic.

## 2018-11-23 NOTE — Telephone Encounter (Signed)
-----   Message from Larey Seat, MD sent at 11/23/2018 12:32 PM EDT ----- JCV was positive- will need to start [place] soon-   RN :  please make sure there is a date set for CSF testing and this includes all labs: ,culture,  protein, glucose, oligoclonal, and serum comparison for bands.

## 2018-11-23 NOTE — Telephone Encounter (Signed)
Spoke with Myriam Jacobson, RN. Was able to locate start form. In process of completing

## 2018-11-23 NOTE — Telephone Encounter (Signed)
I called Joe Martin. I explained that his JCV is positive that that Dr. Brett Fairy recommends that Joe Martin start ocrevus. Our infusion suite has the ocrevus start packet, per Terrence Dupont, RN's note from today. Joe Martin reports that he had his LP testing yesterday. Joe Martin verbalized understanding of results. Joe Martin had no questions at this time but was encouraged to call back if questions arise.

## 2018-11-23 NOTE — Telephone Encounter (Signed)
Unable to locate start form, will check with Myriam Jacobson, RN to see where she placed

## 2018-11-23 NOTE — Telephone Encounter (Signed)
-----   Message from Larey Seat, MD sent at 11/21/2018 12:33 PM EDT ----- Normal electrolytes, TSH, FSH and prolactin levels in this patient with pituitary tumor on MRI and   Demyelinating CNS disease- also negative were Hepatitis b and c and shingles virus and tuberculosis.  Mild anemia noted.   We are waiting for CSF testing , oligoclonal bands.

## 2018-11-23 NOTE — Telephone Encounter (Signed)
I was able to speak to the patient to notify him of his results.  He verbalized understanding.

## 2018-11-27 LAB — CMP14+LP+CBC/D/PLT+ESR WES+...
ALT: 29 IU/L
AST: 20 IU/L
Albumin/Globulin Ratio: 1.9
Albumin: 4.7 g/dL
Alkaline Phosphatase: 52 IU/L
BUN/Creatinine Ratio: 11
BUN: 10 mg/dL
Basophils Absolute: 0 10*3/uL
Basos: 1 %
Bilirubin Total: 0.5 mg/dL
CO2: 19 mmol/L
CRP: <1 mg/L
Calcium: 9.6 mg/dL
Chloride: 101 mmol/L (ref 96–106)
Cholesterol, Total: 155 mg/dL
Creatinine, Ser: 0.87 mg/dL
EOS (ABSOLUTE): 0.1 10*3/uL
Eos: 1 %
Ferritin: 248 ng/mL
Folate: 3.2 ng/mL (ref >3.0)
Free T4: 1.22 ng/dL
Globulin, Total: 2.5 g/dL (ref 1.5–4.5)
Glucose: 125 mg/dL — ABNORMAL HIGH (ref 65–99)
HDL: 65 mg/dL (ref >39)
Hematocrit: 40.5 %
Hemoglobin: 13 g/dL
Hgb A1c MFr Bld: 4.8 % (ref 4.8–5.6)
Immature Grans (Abs): 0 10*3/uL
Immature Granulocytes: 0 %
LDL Calculated: 62 mg/dL
Lymphocytes Absolute: 2.5 10*3/uL
Lymphs: 43 %
MCH: 31.6 pg
MCHC: 32.1 g/dL
MCV: 98 fL
Magnesium: 2.1 mg/dL
Monocytes Absolute: 0.2 10*3/uL
Monocytes: 3 %
Neutrophils Absolute: 3.1 10*3/uL
Neutrophils: 52 %
Platelets: 293 10*3/uL
Potassium: 4 mmol/L
Prostate Specific Ag, Serum: 0.7 ng/mL
RBC: 4.12 x10E6/uL
RDW: 12 %
Sed Rate: 8 mm/hr
Sodium: 138 mmol/L (ref 134–144)
T3, Free: 3.1 pg/mL
TSH: 1.27 u[IU]/mL
Testosterone, Free: 9.3 pg/mL
Testosterone, Total, LC/MS: 636.3 ng/dL
Total Protein: 7.2 g/dL
Triglycerides: 139 mg/dL
VLDL Cholesterol Cal: 28 mg/dL (ref 5–40)
Vit D, 25-Hydroxy: 16.5 ng/mL — ABNORMAL LOW (ref 30.0–100.0)
Vitamin B-12: 958 pg/mL (ref 232–1245)
WBC: 5.9 10*3/uL

## 2018-11-27 LAB — FOLLICLE STIMULATING HORMONE: FSH: 2.2 m[IU]/mL

## 2018-11-27 LAB — QUANTIFERON-TB GOLD PLUS
QuantiFERON Mitogen Value: >10 IU/mL
QuantiFERON Nil Value: 0.02 IU/mL
QuantiFERON TB1 Ag Value: 0.01 IU/mL
QuantiFERON TB2 Ag Value: 0.01 IU/mL
QuantiFERON-TB Gold Plus: NEGATIVE

## 2018-11-27 LAB — PROLACTIN: Prolactin: 12.3 ng/mL

## 2018-11-27 LAB — HEPATITIS C ANTIBODY (REFLEX): HCV Ab: <0.1 s/co ratio (ref 0.0–0.9)

## 2018-11-27 LAB — HEPATITIS B CORE ANTIBODY, TOTAL: Hep B Core Total Ab: NEGATIVE

## 2018-11-27 LAB — VARICELLA ZOSTER ANTIBODY, IGG: Varicella zoster IgG: >4000 index (ref >165)

## 2018-11-27 LAB — HEPATITIS B SURFACE ANTIGEN: Hepatitis B Surface Ag: NEGATIVE

## 2018-11-27 LAB — HEPATIC FUNCTION PANEL: Bilirubin, Direct: 0.15 mg/dL

## 2018-11-27 LAB — HEPATITIS B SURFACE ANTIBODY,QUALITATIVE: Hep B Surface Ab, Qual: NONREACTIVE

## 2018-11-27 LAB — HCV COMMENT:

## 2018-12-02 ENCOUNTER — Telehealth: Payer: Self-pay | Admitting: *Deleted

## 2018-12-02 NOTE — Telephone Encounter (Signed)
Pt short term form faxed to [facility] @ [place] on 11/30/18.

## 2018-12-07 ENCOUNTER — Ambulatory Visit (INDEPENDENT_AMBULATORY_CARE_PROVIDER_SITE_OTHER): Payer: BLUE CROSS/BLUE SHIELD | Admitting: Neurology

## 2018-12-07 ENCOUNTER — Encounter: Payer: Self-pay | Admitting: Neurology

## 2018-12-07 ENCOUNTER — Other Ambulatory Visit: Payer: Self-pay

## 2018-12-07 VITALS — BP 110/86 | HR 97 | Temp 98.4°F | Ht 67.0 in | Wt 193.0 lb

## 2018-12-07 DIAGNOSIS — G959 Disease of spinal cord, unspecified: Secondary | ICD-10-CM

## 2018-12-07 DIAGNOSIS — G379 Demyelinating disease of central nervous system, unspecified: Secondary | ICD-10-CM

## 2018-12-07 DIAGNOSIS — D352 Benign neoplasm of pituitary gland: Secondary | ICD-10-CM | POA: Diagnosis not present

## 2018-12-07 MED ORDER — OCRELIZUMAB 300 MG/10ML IV SOLN: 300.0000 mg | Freq: Once | INTRAVENOUS | 1 refills | Status: AC

## 2018-12-07 NOTE — Patient Instructions (Addendum)
Multiple Sclerosis Multiple sclerosis (MS) is a disease of the brain, spinal cord, and optic nerves (central nervous system). It causes the body's disease-fighting (immune) system to destroy the protective covering (myelin sheath) around nerves in the brain. When this happens, signals (nerve impulses) going to and from the brain and spinal cord do not get sent properly or may not get sent at all. There are several types of MS:  Relapsing-remitting MS. This is the most common type. This causes sudden attacks of symptoms. After an attack, you may recover completely until the next attack, or some symptoms may remain permanently.  Secondary progressive MS. This usually develops after the onset of relapsing-remitting MS. Similar to relapsing-remitting MS, this type also causes sudden attacks of symptoms. Attacks may be less frequent, but symptoms slowly get worse (progress) over time.  Primary progressive MS. This causes symptoms that steadily progress over time. This type of MS does not cause sudden attacks of symptoms. The age of onset of MS varies, but it often develops between 60-64 years of age. MS is a lifelong (chronic) condition. There is no cure, but treatment can help slow down the progression of the disease. What are the causes? The cause of this condition is not known. What increases the risk? You are more likely to develop this condition if:  You are a woman.  You have a relative with MS. However, the condition is not passed from parent to child (inherited).  You have a lack (deficiency) of vitamin D.  You smoke. MS is more common in the [place] than in the [place]. What are the signs or symptoms? Relapsing-remitting and secondary progressive MS cause symptoms to occur in episodes or attacks that may last weeks to months. There may be long periods between attacks in which there are almost no symptoms. Primary progressive MS causes symptoms to  steadily progress after they develop. Symptoms of MS vary because of the many different ways it affects the central nervous system. The main symptoms include:  Vision problems and eye pain.  Numbness.  Weakness.  Inability to move your arms, hands, feet, or legs (paralysis).  Balance problems.  Shaking that you cannot control (tremors).  Muscle spasms.  Problems with thinking (cognitive changes). MS can also cause symptoms that are associated with the disease, but are not always the direct result of an MS attack. They may include:  Inability to control urination or bowel movements (incontinence).  Headaches.  Fatigue.  Inability to tolerate heat.  Emotional changes.  Depression.  Pain. How is this diagnosed? This condition is diagnosed based on:  Your symptoms.  A neurological exam. This involves checking central nervous system function, such as nerve function, reflexes, and coordination.  MRIs of the brain and spinal cord.  Lab tests, including a lumbar puncture that tests the fluid that surrounds the brain and spinal cord (cerebrospinal fluid).  Tests to measure the electrical activity of the brain in response to stimulation (evoked potentials). How is this treated? There is no cure for MS, but medicines can help decrease the number and frequency of attacks and help relieve nuisance symptoms. Treatment options may include:  Medicines that reduce the frequency of attacks. These medicines may be given by injection, by mouth (orally), or through an IV.  Medicines that reduce inflammation (steroids). These may provide short-term relief of symptoms.  Medicines to help control pain, depression, fatigue, or incontinence.  Vitamin D, if you have a deficiency.  Using devices to help you move around (assistive devices), such as braces, a cane, or a walker.  Physical therapy to strengthen and stretch your muscles.  Occupational therapy to help you with everyday  tasks.  Alternative or complementary treatments such as exercise, massage, or acupuncture. Follow these instructions at home:  Take over-the-counter and prescription medicines only as told by your health care provider.  Do not drive or use heavy machinery while taking prescription pain medicine.  Use assistive devices as recommended by your physical therapist or your health care provider.  Exercise as directed by your health care provider.  Return to your normal activities as told by your health care provider. Ask your health care provider what activities are safe for you.  Reach out for support. Share your feelings with friends, family, or a support group.  Keep all follow-up visits as told by your health care provider and therapists. This is important. Where to find more information  National Multiple Sclerosis Society: https://www.nationalmssociety.org Contact a health care provider if:  You feel depressed.  You develop new pain or numbness.  You have tremors.  You have problems with sexual function. Get help right away if:  You develop paralysis.  You develop numbness.  You have problems with your bladder or bowel function.  You develop double vision.  You lose vision in one or both eyes.  You develop suicidal thoughts.  You develop severe confusion. If you ever feel like you may hurt yourself or others, or have thoughts about taking your own life, get help right away. You can go to your nearest emergency department or call:  Your local emergency services (911 in the [place]  A suicide crisis helpline, such as the [place] at 825-677-9010. This is open 24 hours a day. Summary  Multiple sclerosis (MS) is a disease of the central nervous system that causes the body's immune system to destroy the protective covering (myelin sheath) around nerves in the brain.  There are 3 types of MS: relapsing-remitting, secondary progressive, and  primary progressive. Relapsing-remitting and secondary progressive MS cause symptoms to occur in episodes or attacks that may last weeks to months. Primary progressive MS causes symptoms to steadily progress after they develop.  There is no cure for MS, but medicines can help decrease the number and frequency of attacks and help relieve nuisance symptoms. Treatment may also include physical or occupational therapy.  If you develop numbness, paralysis, vision problems, or other neurological symptoms, get help right away. This information is not intended to replace advice given to you by your health care provider. Make sure you discuss any questions you have with your health care provider. Document Released: 03/07/2000 Document Revised: 02/20/2017 Document Reviewed: 05/19/2016 Elsevier Patient Education  2020 [place] injection What is this medicine? OCRELIZUMAB (ok re LIZ ue mab) treats multiple sclerosis. It helps to decrease the number of multiple sclerosis relapses. It is not a cure. This medicine may be used for other purposes; ask your health care provider or pharmacist if you have questions. COMMON BRAND NAME(S): OCREVUS What should I tell my health care provider before I take this medicine? They need to know if you have any of these conditions:  cancer  hepatitis B infection  other infection (especially a virus infection such as chickenpox, cold sores, or herpes)  an unusual or allergic reaction to ocrelizumab, other medicines, foods, dyes or preservatives  pregnant or trying to get pregnant  breast-feeding How should  I use this medicine? This medicine is for infusion into a vein. It is given by a health care professional in a hospital or clinic setting. Talk to your pediatrician regarding the use of this medicine in children. Special care may be needed. Overdosage: If you think you have taken too much of this medicine contact a poison control center or  emergency room at once. NOTE: This medicine is only for you. Do not share this medicine with others. What if I miss a dose? Keep appointments for follow-up doses as directed. It is important not to miss your dose. Call your doctor or health care professional if you are unable to keep an appointment. What may interact with this medicine?  alemtuzumab  daclizumab  dimethyl fumarate  fingolimod  glatiramer  interferon beta  live virus vaccines  mitoxantrone  natalizumab  peginterferon beta  rituximab  steroid medicines like prednisone or cortisone  teriflunomide This list may not describe all possible interactions. Give your health care provider a list of all the medicines, herbs, non-prescription drugs, or dietary supplements you use. Also tell them if you smoke, drink alcohol, or use illegal drugs. Some items may interact with your medicine. What should I watch for while using this medicine? Tell your doctor or healthcare professional if your symptoms do not start to get better or if they get worse. This medicine can cause serious allergic reactions. To reduce your risk you may need to take medicine before treatment with this medicine. Take your medicine as directed. Women should inform their doctor if they wish to become pregnant or think they might be pregnant. There is a potential for serious side effects to an unborn child. Talk to your health care professional or pharmacist for more information. Male patients should use effective birth control methods while receiving this medicine and for 6 months after the last dose. Call your doctor or health care professional for advice if you get a fever, chills or sore throat, or other symptoms of a cold or flu. Do not treat yourself. This drug decreases your body's ability to fight infections. Try to avoid being around people who are sick. If you have a hepatitis B infection or a history of a hepatitis B infection, talk to your  doctor. The symptoms of hepatitis B may get worse if you take this medicine. In some patients, this medicine may cause a serious brain infection that may cause death. If you have any problems seeing, thinking, speaking, walking, or standing, tell your doctor right away. If you cannot reach your doctor, urgently seek other source of medical care. This medicine can decrease the response to a vaccine. If you need to get vaccinated, tell your healthcare professional if you have received this medicine. Extra booster doses may be needed. Talk to your doctor to see if a different vaccination schedule is needed. Talk to your doctor about your risk of cancer. You may be more at risk for certain types of cancers if you take this medicine. What side effects may I notice from receiving this medicine? Side effects that you should report to your doctor or health care professional as soon as possible:  allergic reactions like skin rash, itching or hives, swelling of the face, lips, or tongue  breathing problems  facial flushing  fast, irregular heartbeat  lump or soreness in the breast  signs and symptoms of herpes such as cold sore, shingles, or genital sores  signs and symptoms of infection like fever or chills, cough, sore  throat, pain or trouble passing urine  signs and symptoms of low blood pressure like dizziness; feeling faint or lightheaded, falls; unusually weak or tired  signs and symptoms of progressive multifocal leukoencephalopathy (PML) like changes in vision; clumsiness; confusion; personality changes; weakness on one side of the body  swelling of the ankles, feet, hands Side effects that usually do not require medical attention (report these to your doctor or health care professional if they continue or are bothersome):  back pain  depressed mood  diarrhea  pain, redness, or irritation at site where injected This list may not describe all possible side effects. Call your doctor  for medical advice about side effects. You may report side effects to FDA at 1-800-FDA-1088. Where should I keep my medicine? This drug is given in a hospital or clinic and will not be stored at home. NOTE: This sheet is a summary. It may not cover all possible information. If you have questions about this medicine, talk to your doctor, pharmacist, or health care provider.  2020 Elsevier/Gold Standard (2015-06-26 09:40:25)   Everywhere Result Report Oligoclonal Bands, CSF (Sendout)Resulted: 11/24/2018 1:53 PM [facility] Component Name Value Ref Range  SERUM BANDS 0 bands  CSF BANDS 12 bands  CSF OLIG BANDS INTERPRETATION 12 (A)  Comment: The oligoclonal band assay detected 2 or more unique IgG  bands in the CSF. This is a positive result.   CSF is used in the diagnosis of multiple sclerosis (MS) by  identifying increased intrathecal IgG synthesis  qualitatively (Oligoclonal Bands). The presence of two or  more unique CSF oligoclonal bands was reintroduced as one  diagnostic criteria for MS in the 2017 revised [place]  criteria. These findings, however, are not specific for MS  as CSF-specific IgG synthesis may also be found in patients  with other neurologic diseases including infectious,  inflammatory, cerebrovascular, and paraneoplastic  disorders. Clinical correlation recommended.  Test Performed by: [facility] [id number], [place] [address] Lab Director: Curt Jews M.D. Ph.D.; CLIA# [id number] <2 bands  Specimen Collected on  Cerebrospinal Fluid - Cerebrospinal fluid 11/22/2018 9:45 AM

## 2018-12-07 NOTE — Progress Notes (Addendum)
Provider:  Larey Seat, M D  Referring Provider: No ref. provider found Primary Care Physician:  Patient, No Pcp Per  Chief Complaint  Patient presents with   Follow-up    pt laone, rm 10. pt states that things are about the same. he has not heard anything from infusion on starting ocrevus at this time    RV 12-07-2018, Joe Martin underwent a lumbar puncture on 11-22-2018, it showed no abnormalities but we are still waiting for oligoclonal protein 15 days later which is highly unusual.  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 Annie Main B. Tatter at [facility] will be his neurosurgeon and expects the patient to schedule an appointment at his convenience.  I have referred to endocrinology, he has also referred to ophthalmology for visual field testing.   He reports problems to tolerate heat and humidity , Uthoff phenomenon.  TSH , T4 and prolactine levels here from  11-15-2018 were normal and for some reason retested on 11-25-2018??? At [facility].   My goal is to start Ocrevus as soon as possible since Tysabri is no longer in the consideration after he tested positive for JC virus antibodies. OCREVUS START DATE is pending.  OLIGOCLONALS BANDS WERE PENDING. (11-24-2018) 0 bands   CSF BANDS 12 bands  CSF OLIG BANDS INTERPRETATION 12 (A)  Comment: The oligoclonal band assay detected 2 or more unique IgG  bands in the CSF. This is a positive result.   CSF is used in the diagnosis of multiple sclerosis (MS) by  identifying increased intrathecal IgG synthesis  qualitatively (Oligoclonal Bands). The presence of two or  more unique CSF oligoclonal bands was reintroduced as one  diagnostic criteria for MS in the 2017 revised McDonald  criteria. These findings, however, are not specific for MS  as CSF-specific IgG synthesis may also be found in patients  with other  neurologic diseases including infectious,  inflammatory, cerebrovascular, and paraneoplastic  disorders. Clinical correlation recommended.  Test Performed by: [facility] [address], [place] [address] Lab Director: Curt Jews M.D. Ph.D.; CLIA# [id number] <      RV : 11-15-2018,  I have today the opportunity to meet with Joe Martin and his wife.  As noted after our consultation visit , Joe Martin had a suspicious, likely demyelination based lesion in his cervical cord.   Joe Martin underwent the gated MRI of the brain with and without contrast he needed to go to [facility].  The baseline symptom had been hand numbness for 3 weeks but he also was overall ataxic and uncoordinated.  The study was performed on 11 November 2018 at 4:15 PM showed normal brain volume no atrophy of brain tissue but at least 10 abnormal T2 and flair hyperintensity lesions in the periventricular white matter.  These involve typical locations for acute multiple sclerosis.  The cerebellum appears unaffected aside from a tiny right-sided lesion.  There was a pontine demyelinating lesion.  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  Based on these findings I think that the diagnosis of multiple sclerosis was made.  The patient will have oligoclonal bands drawn, in addition to be added today several laboratory tests to  define which intensive intravenous sleep treatment option should be used.  I do the JC virus panel, hepatitis, tuberculosis, varicella-zoster, as well as several stimulating hormones that are controlled by the pituitary gland and a prolactin level.  Dyann Ruddle to what is Tysabri or Ocrevus the treatment of this young man.  I also will send him to a neurosurgeon in the near future to evaluate the microadenoma which can  usually be transnasally resected.  He will need a visual field test to make sure how much if any visual field has been restricted.  We have met today to sign the treatment agreements for MS type Ocrevus and Tysabri.   He is working in an Designer, television/film set and is affected by hand clumsiness and balance problems. He feels at risk of dropping objects. He noted his feet burning, sometimes feeling numb.  Left leg is weaker , left arm is worse affected than right.       HPI:  Joe Martin is a 38 y.o. male  Is seen here as a referral  from NP  No ref. provider found for follow up on recently obtained MRI lumbar and cervical spine.  Patient was referred to [facility] , but could never reach their office by phone and was never invited to a consultation.   I had the pleasure of meeting Joe Martin today who is a right-handed African-American gentleman age 38 and has last seen his referring nurse practitioner Mrs. Raul Del on 07 July 2018.  This was a virtual visit via via telephone.  The patient had continued to persist with complaints of right leg paresthesia and left arm weakness.  The onset was at the beginning of the year.  A lumbar x-ray was first obtained showing only mild compression deformity and very mild degenerative disc disease not explaining the patient's symptoms.  He was referred to neurosurgery for both the muscle weakness of the left arm and the paresthesia of the right foot and asked to return on 06 October 2018 for a visit.  I have the second note here from podiatrist Ila Mcgill obtained on 07-12-2018.  He prescribed an anti-inflammatory diclofenac and also recommended Flexeril to help with muscle spasms.  He mentioned that neurovascular status was intact, he has some flatfoot deformity bilaterally and was suspected to have a tendinitis.  The patient finally underwent an MRI of the lumbar spine and cervical spine both without contrast.  The studies were obtained on 06 Aug 2018 the lumbar  spine showed no fracture, mild or minimal degenerative changes no suspicious lesions just minor osteophytes which is meeting bone spurs.  No significant stenosis.  Mild bilateral neural foraminal stenosis at the expected L5-S1 region.  And a really mild disc bulge.  No spinal stenosis that would justify or explain the patient's symptoms.  As to the cervical spine MRI also obtained without contrast on 15 May there is no major bony abnormality either, the cord shows T2 hyperintense lesions in the left aspect of the cord extending from C5-6 C6 about 2 cm in length and estimates similar lesion is found lower towards the right anterior aspect of the cord with a length of 1.8 cm spanning from C6 to see 7.  Smaller lesions were noticed left-sided at C4 and on the right at C4 and 5 and this seems to be a mild cord expansion associated with the left-sided lesion.  There was only one region of moderate right-sided spinal stenosis at C5-C6 this could explain some of the arm  pain or weakness.  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  Joe. Odella Aquas. Martin has advised me that his mother has multiple sclerosis and he had wondered if this disease could affect him.    Social history- no regular work out since 2019, smoker 10 cigarettes, Caffeine- soda daily.  Alcohol; 4-5 a week.  He is working - as daytime assembly line work.   he sometimes unloads trucks.    Review of Systems: Out of a complete 14 system review, the patient complains of only the following symptoms, and all other reviewed systems are negative.  He reports difficulties with weight lifting, left arm and hand, loss of grip strength.   Right leg tingling- now affecting both legs. Pins and needles,   Left eye ptosis and abnormal eye movements, left eye shut when looking to the left.   Social History   Socioeconomic History   Marital status: Single    Spouse name: Not on file   Number of children: Not on file     Years of education: Not on file   Highest education level: Not on file  Occupational History   Not on file  Social Needs   Financial resource strain: Not on file   Food insecurity    Worry: Not on file    Inability: Not on file   Transportation needs    Medical: Not on file    Non-medical: Not on file  Tobacco Use   Smoking status: Current Every Day Smoker    Packs/day: 0.30    Types: Cigarettes   Smokeless tobacco: Never Used  Substance and Sexual Activity   Alcohol use: Yes    Alcohol/week: 2.0 - 3.0 standard drinks    Types: 2 - 3 Cans of beer per week   Drug use: No   Sexual activity: Not on file  Lifestyle   Physical activity    Days per week: Not on file    Minutes per session: Not on file   Stress: Not on file  Relationships   Social connections    Talks on phone: Not on file    Gets together: Not on file    Attends religious service: Not on file    Active member of club or organization: Not on file    Attends meetings of clubs or organizations: Not on file    Relationship status: Not on file   Intimate partner violence    Fear of current or ex partner: Not on file    Emotionally abused: Not on file    Physically abused: Not on file    Forced sexual activity: Not on file  Other Topics Concern   Not on file  Social History Narrative   Not on file    Family History  Problem Relation Age of Onset   Multiple sclerosis Mother    Diabetes Mother    Prostate cancer Father    Hypertension Father     No past medical history on file.  No past surgical history on file.  Current Outpatient Medications  Medication Sig Dispense Refill   ocrelizumab (OCREVUS) 300 MG/10ML injection Inject into the vein once.     No current facility-administered medications for this visit.     Allergies as of 12/07/2018 - Review Complete 12/07/2018  Allergen Reaction Noted   Peanut-containing drug products Swelling 12/27/2011    Vitals: BP 110/86     Pulse 97    Temp 98.4 F (36.9 C)  Ht '5\' 7"'$  (1.702 m)    Wt 193 lb (87.5 kg)    BMI 30.23 kg/m  Last Weight:  Wt Readings from Last 1 Encounters:  12/07/18 193 lb (87.5 kg)   Last Height:   Ht Readings from Last 1 Encounters:  12/07/18 '5\' 7"'$  (1.702 m)    Physical exam:  General: The patient is awake, alert and appears not in acute distress. The patient strongly smelling of smoke. Head: Normocephalic, atraumatic. Neck is supple. Cardiovascular:  Regular rate and rhythm, without  murmurs or carotid bruit, and without distended neck veins. Respiratory: Lungs are clear to auscultation. Skin:  Without evidence of edema, or rash Trunk:patient  has normal posture.  Neurologic exam : The patient is awake and alert, oriented to place and time.  Memory subjective  described as intact. There is a normal attention span & concentration ability. Speech is fluent with dysarthria, and he is not eloquent. Mood and affect are aloof .  Cranial nerves: Pupils are equal and briskly reactive to light. He wears strong corrective lenses. Funduscopic exam ; not possible - patient will need to see an ophthalmologist . Extraocular movements  in vertical and horizontal planes described above, left  over right ptosis.  Visual fields by finger perimetry are intact. Hearing to finger rub intact.   Facial sensation intact to fine touch. Facial motor strength is symmetric and tongue and uvula move midline. Tongue protrusion into either cheek is normal. Shoulder shrug is normal.   Motor exam:  Normal muscle bulk , but weakness of the grip.   Right over left dorsal flexion , weakness on the left. Hip flexion weakness on the left, adduction is strong, ab-duction weak.  Pronator drift left, and dysmetria on the right.  Sensory:  Hand and feet are now numb on both sides. Fine touch, pinprick and vibration were tested in all extremities. Hands feel numb and tingly, pin and needles.  Coordination: Rapid alternating  movements in the fingers/hands were bilaterally slowed . Finger-to-nose maneuver with evidence of dysmetria -Left sided dysmetria today more then right.  Gait and station: Patient walks without assistive device  and is able unassisted to walk- he wears very low hanging baggy pants that make the observation difficult.  Right foot outward pointing-  Limp- stooped, turning with 4-5 steps.  Left is weaker, also stiffer.   Stance is stable and normal.  Tandem gait deferred.. Romberg testing is positive.    Deep tendon reflexes: in the upper  extremities are symmetric and brisk- left over right.  Lower extremity hyperreflexia on the left, versus normal on the right. Babinski maneuver response up-going on the left. Assessment:  After physical and neurologic examination, review of laboratory studies, imaging, neurophysiology testing and pre-existing records, assessment is that of :  Depressive appearing u young African - American male.  Ataxic gait , abnormal eye movements, sensory and motor function loss, hyperreflexia but  no fasculations. Left dysmetria.  Beginning of symptoms Sept-Dec 2019 .  The demyelinating lesions are very descriptive for MS- and his Family history  is positive for MS in his mother. He denies incontinence, loss of taste or smell. 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    Plan:  Treatment plan and additional workup :  We have decided on Ocrevus after JCV positive status ,  Tysabri is not longer in consideration ,lab tests have completed.  The  patient also will have pituitary adenoma resection, based on recent NS  evaluation. Visual field exam not yet completed, will be 9-28th. .   Everywhere Result Report Oligoclonal Bands, CSF (Sendout)Resulted: 11/24/2018 1:53 PM [facility] Component Name Value Ref Range  SERUM BANDS 0 bands  CSF BANDS 12 bands  CSF OLIG BANDS INTERPRETATION 12 (A)  Comment: The  oligoclonal band assay detected 2 or more unique IgG  bands in the CSF. This is a positive result.   CSF is used in the diagnosis of multiple sclerosis (MS) by  identifying increased intrathecal IgG synthesis  qualitatively (Oligoclonal Bands). The presence of two or  more unique CSF oligoclonal bands was reintroduced as one  diagnostic criteria for MS in the 2017 revised McDonald  criteria. These findings, however, are not specific for MS  as CSF-specific IgG synthesis may also be found in patients  with other neurologic diseases including infectious,  inflammatory, cerebrovascular, and paraneoplastic  disorders. Clinical correlation recommended.  Test Performed by: [facility] [address], [place] [address] Lab Director: Curt Jews M.D. Ph.D.; CLIA# [id number] <2 bands  Specimen Collected on  Cerebrospinal Fluid - Cerebrospinal fluid 11/22/2018 9:45 AM     Rv in 2-4  with me or NP.   Asencion Partridge Donnamae Muilenburg MD 12/07/2018

## 2018-12-09 ENCOUNTER — Other Ambulatory Visit: Payer: Self-pay | Admitting: Neurology

## 2018-12-09 ENCOUNTER — Telehealth: Payer: Self-pay | Admitting: Neurology

## 2018-12-09 DIAGNOSIS — D352 Benign neoplasm of pituitary gland: Secondary | ICD-10-CM

## 2018-12-09 DIAGNOSIS — Z79899 Other long term (current) drug therapy: Secondary | ICD-10-CM

## 2018-12-09 DIAGNOSIS — G379 Demyelinating disease of central nervous system, unspecified: Secondary | ICD-10-CM

## 2018-12-09 DIAGNOSIS — R29898 Other symptoms and signs involving the musculoskeletal system: Secondary | ICD-10-CM

## 2018-12-09 DIAGNOSIS — G35 Multiple sclerosis: Secondary | ICD-10-CM

## 2018-12-09 NOTE — Telephone Encounter (Signed)
Faxed notes to Dr. Riki Sheer Telephone [phone] - fax [phone] . Patient has a video visit 12/17/2018 . I have faxed all of our notes. Dr. Nestor Ramp will start infusions  asap  But patient has to have a  Consult first .  Patient is aware of his apt  With [facility] . Patient is aware that [facility] will take over his care. Patient is aware we are CX his apt for December .   Myriam Jacobson Patient would like you to mail him some literature on MS Please .

## 2018-12-09 NOTE — Telephone Encounter (Signed)
Please give Mr Bingaman the following information to look for on the internet   [employer]cl[employer] - they have a genetic trial which is of interest to him as his mother had MS.  [email or web address]

## 2018-12-12 ENCOUNTER — Encounter: Payer: Self-pay | Admitting: Nurse Practitioner

## 2018-12-13 ENCOUNTER — Telehealth: Payer: Self-pay | Admitting: *Deleted

## 2018-12-13 ENCOUNTER — Telehealth: Payer: Self-pay | Admitting: Neurology

## 2018-12-13 NOTE — Telephone Encounter (Signed)
Pt fmla paper work faxed to [employer] @ [phone]

## 2018-12-13 NOTE — Telephone Encounter (Signed)
Received FMLA forms for the patient. I have completed the forms and Dr Brett Fairy has signed to cover him from the time we have evaluated the patient. As of 12/17/2018 patient will have a visit with another neurologist in the [facility] which is in network for the patient. They also have a infusions suite there where the patient can be in network for treatment. I would defer any further FMLA from 12/17/2018 forward to the neurologist department patient will be establishing with. Forms given to Milon Dikes to send in for the patient.

## 2018-12-14 ENCOUNTER — Encounter: Payer: Self-pay | Admitting: Neurology

## 2018-12-16 NOTE — Telephone Encounter (Signed)
Attempt to call patient to inform to make an appt. With his Neurosurgeon and contact them regarding his images.  No answer and LVM for patient.

## 2019-01-27 ENCOUNTER — Ambulatory Visit: Payer: BLUE CROSS/BLUE SHIELD | Admitting: Adult Health

## 2019-03-02 ENCOUNTER — Ambulatory Visit: Payer: BLUE CROSS/BLUE SHIELD | Admitting: Neurology

## 2019-03-21 DIAGNOSIS — Z0271 Encounter for disability determination: Secondary | ICD-10-CM

## 2020-07-24 IMAGING — MR MRI CERVICAL SPINE WITHOUT CONTRAST
4 of 7 series · 20 of 48 positions shown · non-contrast
Comparison: Cervical spine radiographs 03/31/2018

CLINICAL DATA: Numbness and tingling of the left arm and right leg.
Family history of multiple sclerosis.

EXAM:
MRI CERVICAL SPINE WITHOUT CONTRAST
TECHNIQUE: Multiplanar, multisequence MR imaging of the cervical spine was
performed. No intravenous contrast was administered.

[Series 5: T2 post-contrast · sagittal · 3.0mm · 0.41mm/px · 3 of 12 slices shown]
[im 1/12]
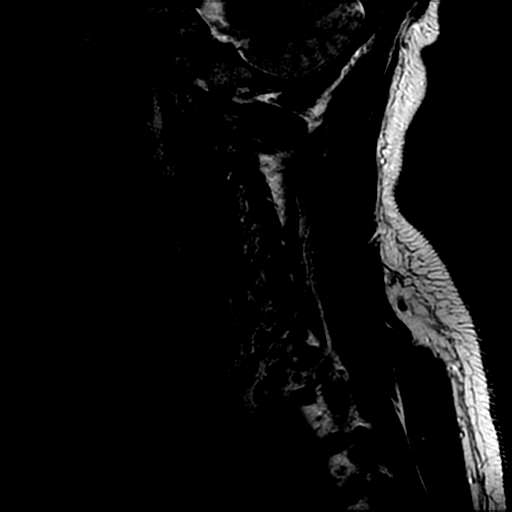
[im 6/12]
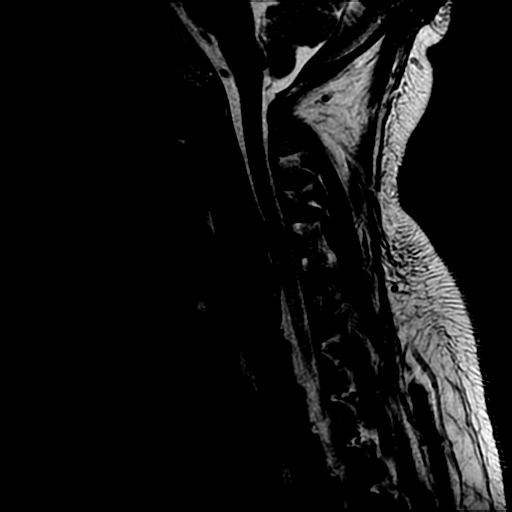
[im 12/12]
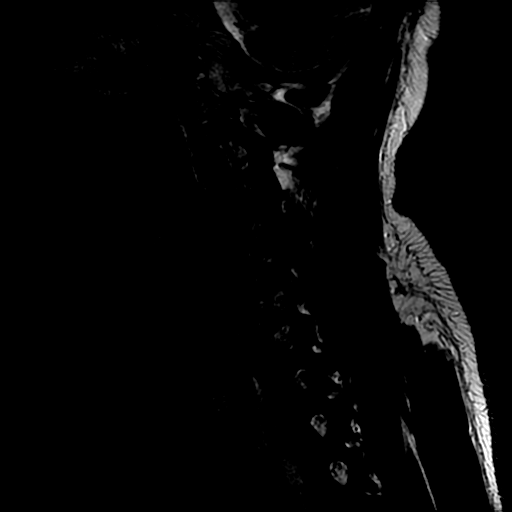

[Series 7: T2 · axial · 3.0mm · 0.39mm/px · z∈[+300,+433]mm · 8 of 40 slices shown (1 of 3)]
[im 1/40]
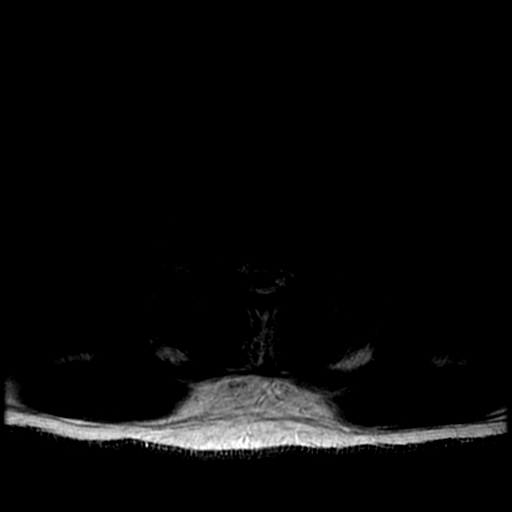
[im 5/40]
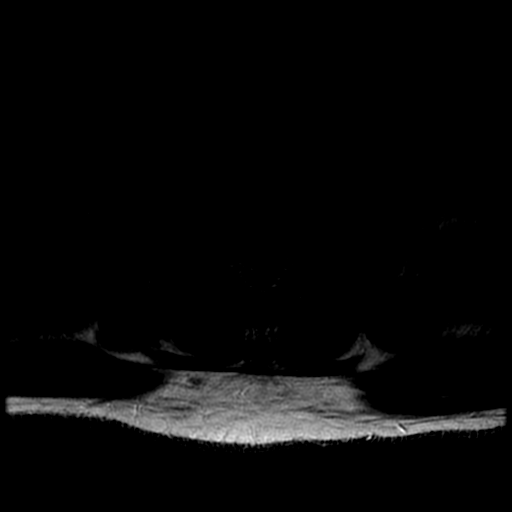
[im 14/40]
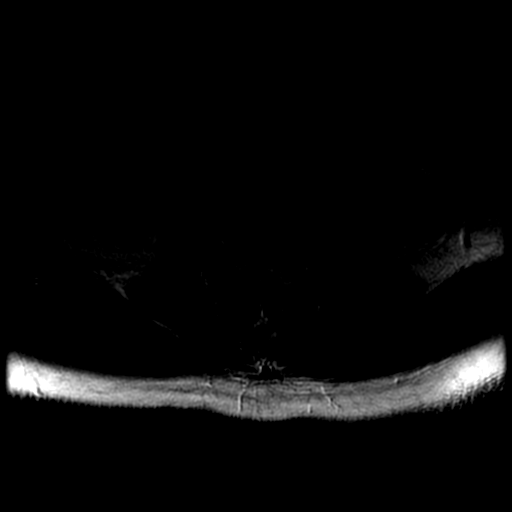
[im 18/40]
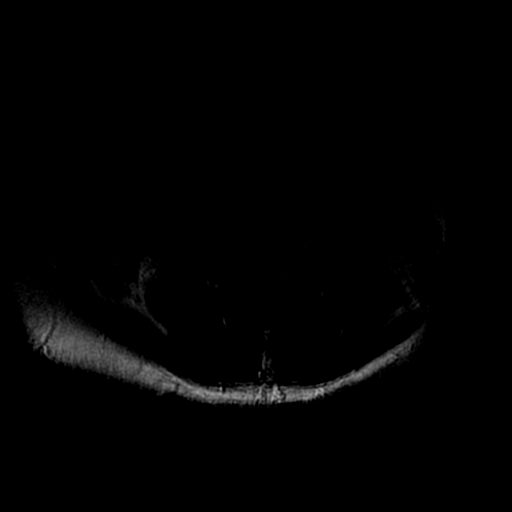
[im 22/40]
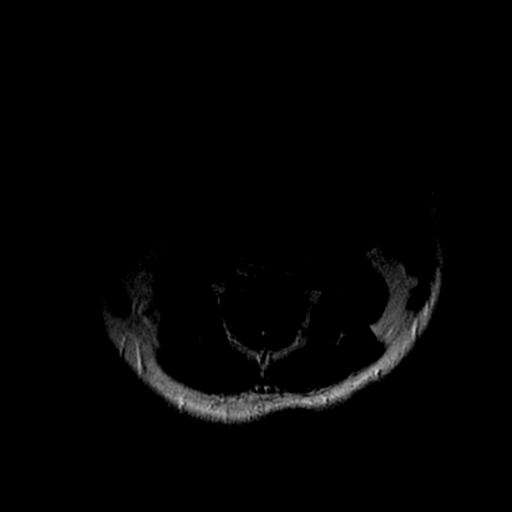
[im 27/40]
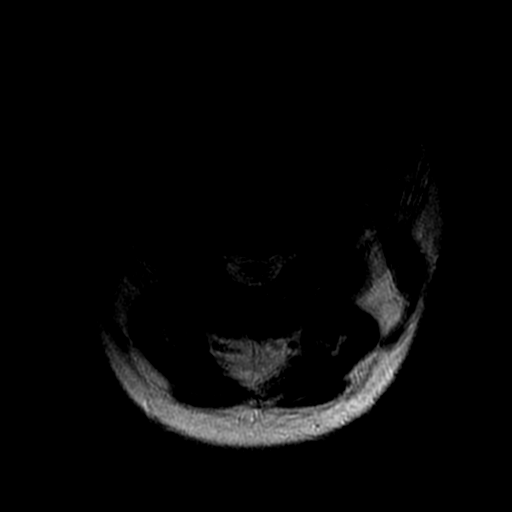
[im 35/40]
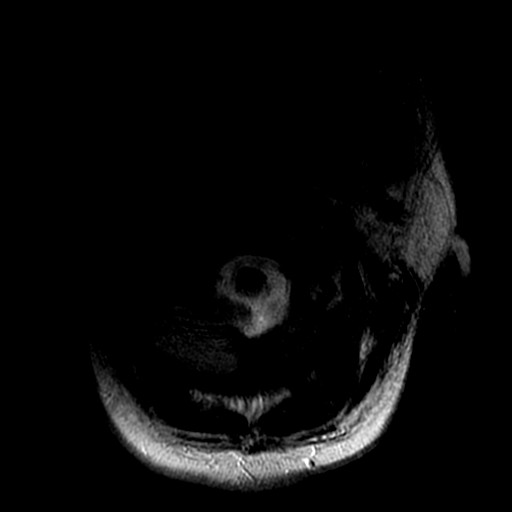
[im 40/40]
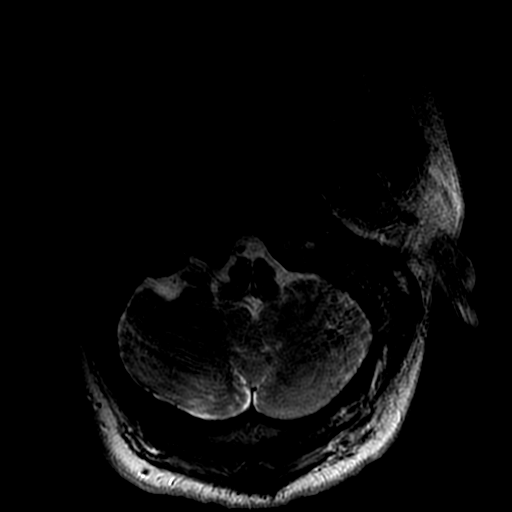

[Series 8: T2 · axial · 3.0mm · 0.39mm/px · z∈[+300,+416]mm · 6 of 40 slices shown (2 of 3)]
[im 1/40]
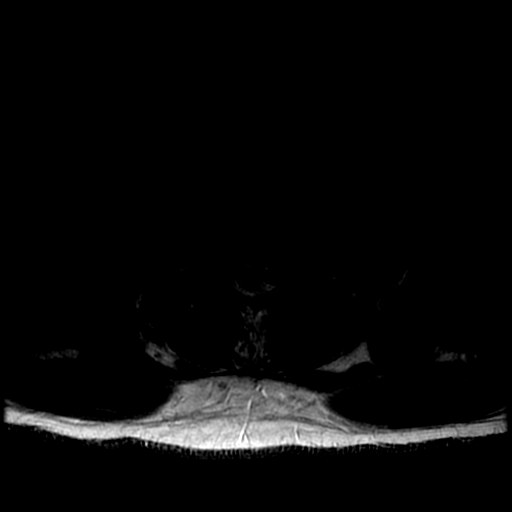
[im 5/40]
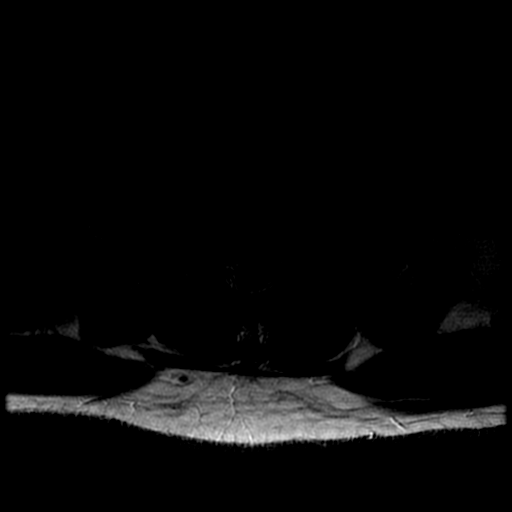
[im 14/40]
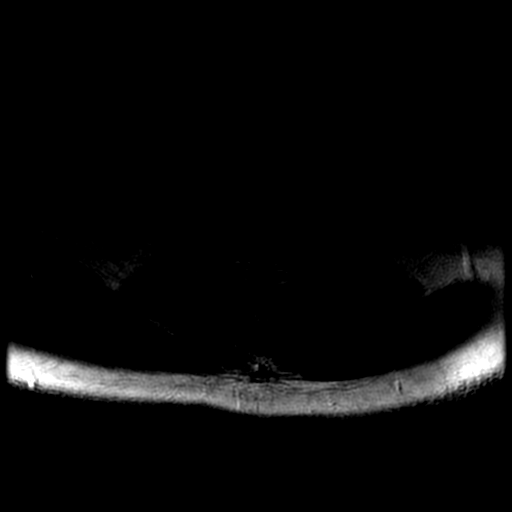
[im 18/40]
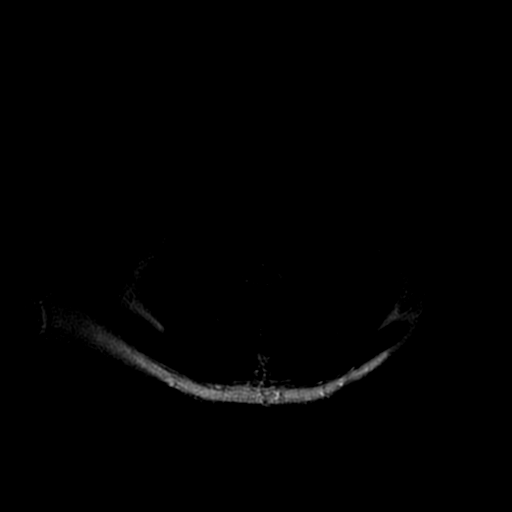
[im 22/40]
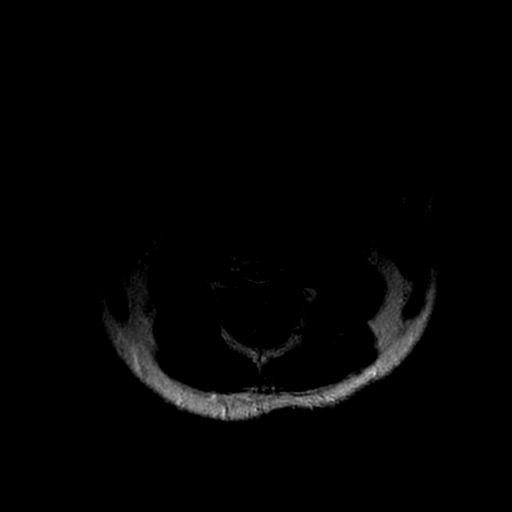
[im 35/40]
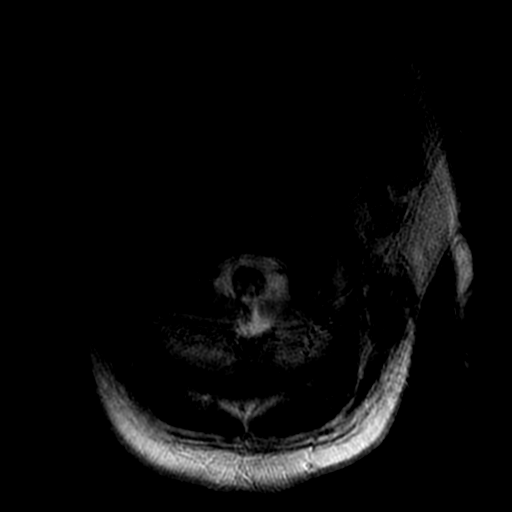

[Series 9: T2 · axial · 3.0mm · 0.78mm/px · z∈[+313,+416]mm · 3 of 40 slices shown (3 of 3)]
[im 5/40]
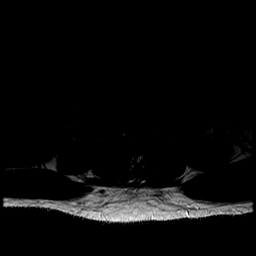
[im 22/40]
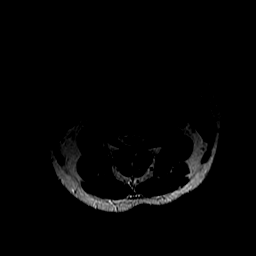
[im 35/40]
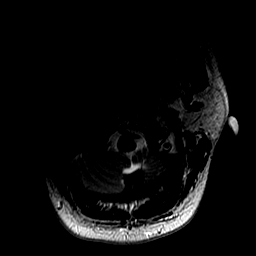

[20 of 48 positions shown; findings below may reference images not displayed]

FINDINGS: The study is motion degraded throughout. Axial T2 spin echo images
are severely motion degraded despite multiple repeated imaging
attempts.

Alignment: Cervical spine straightening.  No listhesis.

Vertebrae: No fracture, suspicious osseous lesion, or significant
marrow edema.

Cord: T2 hyperintense lesion in the left aspect of the cord at C5-6
extending over approximately 2 cm in length. Similar lesion in the
right aspect of the cord at C6-7 with a length of 1.8 cm. Suspected
smaller lesions on the left at C4 and on the right at C4-5. Likely
mild cord expansion associated with the left-sided C5-6 lesion.

Posterior Fossa, vertebral arteries, paraspinal tissues:
Unremarkable.

Disc levels:

C2-3 and C3-4: Negative.

C4-5: Mild disc bulging, small right paracentral disc protrusion,
and mild uncovertebral spurring result in mild spinal stenosis with
mild right-sided cord flattening. No significant neural foraminal
stenosis.

C5-6: A moderate-sized right paracentral/posterolateral disc
protrusion and uncovertebral spurring result in moderate right-sided
spinal stenosis with mild right-sided cord flattening and likely
mild medial right neural foraminal stenosis.

C6-7: Minimal disc bulging and uncovertebral spurring without
evidence of significant stenosis.

C7-T1: Mild disc bulging without evidence of significant stenosis.
IMPRESSION: 1. Motion degraded examination.
2. Multiple T2 hyperintense cervical spinal cord lesions most
concerning for demyelinating disease. Consider brain MRI and CSF
analysis for further evaluation.
3. Disc protrusions resulting in moderate right-sided spinal
stenosis at C5-6 and mild spinal stenosis at C4-5.

## 2021-09-20 ENCOUNTER — Other Ambulatory Visit: Payer: Self-pay | Admitting: Internal Medicine

## 2021-09-21 LAB — CBC
HCT: 37.4 % — ABNORMAL LOW (ref 38.5–50.0)
Hemoglobin: 12.6 g/dL — ABNORMAL LOW (ref 13.2–17.1)
MCH: 32.2 pg (ref 27.0–33.0)
MCHC: 33.7 g/dL (ref 32.0–36.0)
MCV: 95.7 fL (ref 80.0–100.0)
MPV: 10.4 fL (ref 7.5–12.5)
Platelets: 275 10*3/uL (ref 140–400)
RBC: 3.91 10*6/uL — ABNORMAL LOW (ref 4.20–5.80)
RDW: 11.9 % (ref 11.0–15.0)
WBC: 5.8 10*3/uL (ref 3.8–10.8)

## 2021-09-21 LAB — LIPID PANEL
Cholesterol: 188 mg/dL (ref <200)
HDL: 99 mg/dL (ref >=40)
LDL Cholesterol (Calc): 71 mg/dL (calc)
Non-HDL Cholesterol (Calc): 89 mg/dL (calc) (ref <130)
Total CHOL/HDL Ratio: 1.9 (calc) (ref <5.0)
Triglycerides: 97 mg/dL (ref <150)

## 2021-09-21 LAB — COMPLETE METABOLIC PANEL WITH GFR
AG Ratio: 1.9 (calc) (ref 1.0–2.5)
ALT: 28 U/L (ref 9–46)
AST: 29 U/L (ref 10–40)
Albumin: 4.7 g/dL (ref 3.6–5.1)
Alkaline phosphatase (APISO): 54 U/L (ref 36–130)
BUN: 18 mg/dL (ref 7–25)
CO2: 23 mmol/L (ref 20–32)
Calcium: 9.6 mg/dL (ref 8.6–10.3)
Chloride: 106 mmol/L (ref 98–110)
Creat: 0.96 mg/dL (ref 0.60–1.29)
Globulin: 2.5 g/dL (calc) (ref 1.9–3.7)
Glucose, Bld: 82 mg/dL (ref 65–99)
Potassium: 4.2 mmol/L (ref 3.5–5.3)
Sodium: 140 mmol/L (ref 135–146)
Total Bilirubin: 0.4 mg/dL (ref 0.2–1.2)
Total Protein: 7.2 g/dL (ref 6.1–8.1)
eGFR: 101 mL/min/{1.73_m2} (ref >=60)

## 2021-09-21 LAB — URINE CULTURE
MICRO NUMBER:: 13594419
Result:: NO GROWTH
SPECIMEN QUALITY:: ADEQUATE

## 2021-09-21 LAB — PSA: PSA: 0.53 ng/mL (ref <=4.00)

## 2021-09-21 LAB — VITAMIN D 25 HYDROXY (VIT D DEFICIENCY, FRACTURES): Vit D, 25-Hydroxy: 69 ng/mL (ref 30–100)

## 2021-09-21 LAB — TSH: TSH: 0.74 mIU/L (ref 0.40–4.50)

## 2022-01-06 ENCOUNTER — Other Ambulatory Visit: Payer: Self-pay | Admitting: Internal Medicine

## 2022-01-07 LAB — URIC ACID: Uric Acid, Serum: 5.4 mg/dL (ref 4.0–8.0)

## 2022-01-07 LAB — EXTRA LAV TOP TUBE

## 2022-04-14 DIAGNOSIS — G35 Multiple sclerosis: Secondary | ICD-10-CM | POA: Diagnosis not present

## 2022-06-24 DIAGNOSIS — D649 Anemia, unspecified: Secondary | ICD-10-CM | POA: Diagnosis not present

## 2022-06-24 DIAGNOSIS — E237 Disorder of pituitary gland, unspecified: Secondary | ICD-10-CM | POA: Diagnosis not present

## 2022-06-24 DIAGNOSIS — Z0000 Encounter for general adult medical examination without abnormal findings: Secondary | ICD-10-CM | POA: Diagnosis not present

## 2022-10-13 DIAGNOSIS — G35 Multiple sclerosis: Secondary | ICD-10-CM | POA: Diagnosis not present

## 2023-05-18 DIAGNOSIS — G35 Multiple sclerosis: Secondary | ICD-10-CM | POA: Diagnosis not present

## 2023-06-25 DIAGNOSIS — G35 Multiple sclerosis: Secondary | ICD-10-CM | POA: Diagnosis not present

## 2023-06-25 DIAGNOSIS — F322 Major depressive disorder, single episode, severe without psychotic features: Secondary | ICD-10-CM | POA: Diagnosis not present

## 2023-06-25 DIAGNOSIS — F419 Anxiety disorder, unspecified: Secondary | ICD-10-CM | POA: Diagnosis not present

## 2023-06-25 DIAGNOSIS — D352 Benign neoplasm of pituitary gland: Secondary | ICD-10-CM | POA: Diagnosis not present

## 2023-06-25 DIAGNOSIS — F32A Depression, unspecified: Secondary | ICD-10-CM | POA: Diagnosis not present

## 2023-06-25 DIAGNOSIS — Z0000 Encounter for general adult medical examination without abnormal findings: Secondary | ICD-10-CM | POA: Diagnosis not present

## 2023-07-03 DIAGNOSIS — G35 Multiple sclerosis: Secondary | ICD-10-CM | POA: Diagnosis not present

## 2023-07-08 DIAGNOSIS — R202 Paresthesia of skin: Secondary | ICD-10-CM | POA: Diagnosis not present

## 2023-09-21 DIAGNOSIS — E559 Vitamin D deficiency, unspecified: Secondary | ICD-10-CM | POA: Diagnosis not present

## 2023-09-21 DIAGNOSIS — R5383 Other fatigue: Secondary | ICD-10-CM | POA: Diagnosis not present

## 2023-09-21 DIAGNOSIS — Z79899 Other long term (current) drug therapy: Secondary | ICD-10-CM | POA: Diagnosis not present

## 2023-09-21 DIAGNOSIS — R0602 Shortness of breath: Secondary | ICD-10-CM | POA: Diagnosis not present

## 2023-09-21 DIAGNOSIS — Z7251 High risk heterosexual behavior: Secondary | ICD-10-CM | POA: Diagnosis not present

## 2023-09-21 DIAGNOSIS — R3129 Other microscopic hematuria: Secondary | ICD-10-CM | POA: Diagnosis not present

## 2023-09-21 DIAGNOSIS — M129 Arthropathy, unspecified: Secondary | ICD-10-CM | POA: Diagnosis not present

## 2023-09-21 DIAGNOSIS — Z1159 Encounter for screening for other viral diseases: Secondary | ICD-10-CM | POA: Diagnosis not present

## 2023-09-21 DIAGNOSIS — Z125 Encounter for screening for malignant neoplasm of prostate: Secondary | ICD-10-CM | POA: Diagnosis not present

## 2023-09-21 DIAGNOSIS — D539 Nutritional anemia, unspecified: Secondary | ICD-10-CM | POA: Diagnosis not present

## 2023-09-21 DIAGNOSIS — Z0000 Encounter for general adult medical examination without abnormal findings: Secondary | ICD-10-CM | POA: Diagnosis not present

## 2023-09-30 DIAGNOSIS — Z79899 Other long term (current) drug therapy: Secondary | ICD-10-CM | POA: Diagnosis not present

## 2023-10-12 DIAGNOSIS — E611 Iron deficiency: Secondary | ICD-10-CM | POA: Diagnosis not present

## 2023-10-12 DIAGNOSIS — R3129 Other microscopic hematuria: Secondary | ICD-10-CM | POA: Diagnosis not present

## 2023-10-12 DIAGNOSIS — E6609 Other obesity due to excess calories: Secondary | ICD-10-CM | POA: Diagnosis not present

## 2023-10-12 DIAGNOSIS — Z79899 Other long term (current) drug therapy: Secondary | ICD-10-CM | POA: Diagnosis not present

## 2023-10-12 DIAGNOSIS — R945 Abnormal results of liver function studies: Secondary | ICD-10-CM | POA: Diagnosis not present

## 2023-10-12 DIAGNOSIS — M109 Gout, unspecified: Secondary | ICD-10-CM | POA: Diagnosis not present

## 2023-10-12 DIAGNOSIS — R7989 Other specified abnormal findings of blood chemistry: Secondary | ICD-10-CM | POA: Diagnosis not present

## 2023-10-12 DIAGNOSIS — G35 Multiple sclerosis: Secondary | ICD-10-CM | POA: Diagnosis not present

## 2023-10-14 DIAGNOSIS — R945 Abnormal results of liver function studies: Secondary | ICD-10-CM | POA: Diagnosis not present

## 2023-10-15 DIAGNOSIS — Z79899 Other long term (current) drug therapy: Secondary | ICD-10-CM | POA: Diagnosis not present

## 2023-10-19 DIAGNOSIS — M722 Plantar fascial fibromatosis: Secondary | ICD-10-CM | POA: Diagnosis not present

## 2023-11-12 DIAGNOSIS — Z79899 Other long term (current) drug therapy: Secondary | ICD-10-CM | POA: Diagnosis not present

## 2023-11-12 DIAGNOSIS — M109 Gout, unspecified: Secondary | ICD-10-CM | POA: Diagnosis not present

## 2023-11-12 DIAGNOSIS — G35 Multiple sclerosis: Secondary | ICD-10-CM | POA: Diagnosis not present

## 2023-11-12 DIAGNOSIS — R7989 Other specified abnormal findings of blood chemistry: Secondary | ICD-10-CM | POA: Diagnosis not present

## 2023-11-12 DIAGNOSIS — R892 Abnormal level of other drugs, medicaments and biological substances in specimens from other organs, systems and tissues: Secondary | ICD-10-CM | POA: Diagnosis not present

## 2023-11-12 DIAGNOSIS — M21372 Foot drop, left foot: Secondary | ICD-10-CM | POA: Diagnosis not present

## 2023-11-12 DIAGNOSIS — E6609 Other obesity due to excess calories: Secondary | ICD-10-CM | POA: Diagnosis not present

## 2023-11-12 DIAGNOSIS — E611 Iron deficiency: Secondary | ICD-10-CM | POA: Diagnosis not present

## 2023-11-12 DIAGNOSIS — Z9181 History of falling: Secondary | ICD-10-CM | POA: Diagnosis not present

## 2023-11-12 DIAGNOSIS — N200 Calculus of kidney: Secondary | ICD-10-CM | POA: Diagnosis not present

## 2023-11-17 DIAGNOSIS — Z79899 Other long term (current) drug therapy: Secondary | ICD-10-CM | POA: Diagnosis not present

## 2023-11-17 DIAGNOSIS — G35 Multiple sclerosis: Secondary | ICD-10-CM | POA: Diagnosis not present

## 2023-11-25 DIAGNOSIS — G35 Multiple sclerosis: Secondary | ICD-10-CM | POA: Diagnosis not present

## 2023-12-08 DIAGNOSIS — G4719 Other hypersomnia: Secondary | ICD-10-CM | POA: Diagnosis not present

## 2023-12-08 DIAGNOSIS — R0602 Shortness of breath: Secondary | ICD-10-CM | POA: Diagnosis not present

## 2023-12-08 DIAGNOSIS — G35 Multiple sclerosis: Secondary | ICD-10-CM | POA: Diagnosis not present

## 2023-12-15 DIAGNOSIS — M21372 Foot drop, left foot: Secondary | ICD-10-CM | POA: Diagnosis not present

## 2023-12-15 DIAGNOSIS — Z9181 History of falling: Secondary | ICD-10-CM | POA: Diagnosis not present

## 2023-12-15 DIAGNOSIS — N200 Calculus of kidney: Secondary | ICD-10-CM | POA: Diagnosis not present

## 2023-12-15 DIAGNOSIS — G35 Multiple sclerosis: Secondary | ICD-10-CM | POA: Diagnosis not present

## 2023-12-15 DIAGNOSIS — R7989 Other specified abnormal findings of blood chemistry: Secondary | ICD-10-CM | POA: Diagnosis not present

## 2023-12-15 DIAGNOSIS — E611 Iron deficiency: Secondary | ICD-10-CM | POA: Diagnosis not present

## 2023-12-15 DIAGNOSIS — Z79899 Other long term (current) drug therapy: Secondary | ICD-10-CM | POA: Diagnosis not present

## 2023-12-15 DIAGNOSIS — E6609 Other obesity due to excess calories: Secondary | ICD-10-CM | POA: Diagnosis not present

## 2023-12-15 DIAGNOSIS — M109 Gout, unspecified: Secondary | ICD-10-CM | POA: Diagnosis not present

## 2023-12-18 DIAGNOSIS — Z79899 Other long term (current) drug therapy: Secondary | ICD-10-CM | POA: Diagnosis not present

## 2024-01-15 DIAGNOSIS — F32A Depression, unspecified: Secondary | ICD-10-CM | POA: Diagnosis not present

## 2024-01-15 DIAGNOSIS — F419 Anxiety disorder, unspecified: Secondary | ICD-10-CM | POA: Diagnosis not present

## 2024-01-15 DIAGNOSIS — R5381 Other malaise: Secondary | ICD-10-CM | POA: Diagnosis not present

## 2024-01-15 DIAGNOSIS — G35D Multiple sclerosis, unspecified: Secondary | ICD-10-CM | POA: Diagnosis not present

## 2024-01-20 DIAGNOSIS — G35D Multiple sclerosis, unspecified: Secondary | ICD-10-CM | POA: Diagnosis not present

## 2024-01-21 DIAGNOSIS — R2689 Other abnormalities of gait and mobility: Secondary | ICD-10-CM | POA: Diagnosis not present

## 2024-01-21 DIAGNOSIS — G35D Multiple sclerosis, unspecified: Secondary | ICD-10-CM | POA: Diagnosis not present

## 2024-01-27 DIAGNOSIS — G35D Multiple sclerosis, unspecified: Secondary | ICD-10-CM | POA: Diagnosis not present

## 2024-01-28 DIAGNOSIS — G35D Multiple sclerosis, unspecified: Secondary | ICD-10-CM | POA: Diagnosis not present

## 2024-01-28 DIAGNOSIS — R5381 Other malaise: Secondary | ICD-10-CM | POA: Diagnosis not present

## 2024-01-28 DIAGNOSIS — R269 Unspecified abnormalities of gait and mobility: Secondary | ICD-10-CM | POA: Diagnosis not present

## 2024-01-28 DIAGNOSIS — R2689 Other abnormalities of gait and mobility: Secondary | ICD-10-CM | POA: Diagnosis not present

## 2024-02-02 DIAGNOSIS — R2689 Other abnormalities of gait and mobility: Secondary | ICD-10-CM | POA: Diagnosis not present

## 2024-02-04 DIAGNOSIS — R269 Unspecified abnormalities of gait and mobility: Secondary | ICD-10-CM | POA: Diagnosis not present

## 2024-02-04 DIAGNOSIS — R5381 Other malaise: Secondary | ICD-10-CM | POA: Diagnosis not present

## 2024-02-04 DIAGNOSIS — R2689 Other abnormalities of gait and mobility: Secondary | ICD-10-CM | POA: Diagnosis not present

## 2024-02-09 DIAGNOSIS — R5381 Other malaise: Secondary | ICD-10-CM | POA: Diagnosis not present

## 2024-02-09 DIAGNOSIS — R269 Unspecified abnormalities of gait and mobility: Secondary | ICD-10-CM | POA: Diagnosis not present

## 2024-02-09 DIAGNOSIS — R2689 Other abnormalities of gait and mobility: Secondary | ICD-10-CM | POA: Diagnosis not present

## 2024-02-11 DIAGNOSIS — R2689 Other abnormalities of gait and mobility: Secondary | ICD-10-CM | POA: Diagnosis not present

## 2024-02-11 DIAGNOSIS — R269 Unspecified abnormalities of gait and mobility: Secondary | ICD-10-CM | POA: Diagnosis not present

## 2024-02-11 DIAGNOSIS — R5381 Other malaise: Secondary | ICD-10-CM | POA: Diagnosis not present

## 2024-03-09 DIAGNOSIS — K760 Fatty (change of) liver, not elsewhere classified: Secondary | ICD-10-CM | POA: Diagnosis not present

## 2024-03-09 DIAGNOSIS — Z1211 Encounter for screening for malignant neoplasm of colon: Secondary | ICD-10-CM | POA: Diagnosis not present

## 2024-03-31 ENCOUNTER — Encounter (HOSPITAL_BASED_OUTPATIENT_CLINIC_OR_DEPARTMENT_OTHER): Payer: Self-pay | Admitting: Pulmonary Disease

## 2024-03-31 DIAGNOSIS — G4710 Hypersomnia, unspecified: Secondary | ICD-10-CM

## 2024-04-21 NOTE — Progress Notes (Signed)
 "    [facility]  - Family Medicine Myra Master  Date of Service: 04/21/2024 Patient Name: Joe Martin Patient DOB: 1980-11-14    Subjective:   Shoulder Pain .  Joe Martin is a 44 y.o. male who presents with left shoulder pain. The symptoms began several years ago. Aggravating factors: no known event. Pain is located around the acromioclavicular [facility]) joint. Discomfort is described as aching and weakness on left side. Symptoms are exacerbated by overhead movements and lying on the shoulder.   HPI Patient comes in for Shoulder Pain  Seen in Oct for worsening left side weakness. Ordered PT for him.  Hx of MS.  Sees neuro. His shoulder pain is not as severe today.   MRI cervical spine completed: 03/15/2024 5-C6: Small posterior disc osteophyte complexes without substantial canal stenosis. Facet and uncovertebral hypertrophy contribute to moderate left foraminal stenosis, which appears progressed relative to the prior MRI from 2022.  C6-C7: Small left central posterior disc osteophyte complex along with facet and uncovertebral hypertrophy without substantial substantial canal or foraminal stenosis.  C7-T1: No substantial canal stenosis. Facet hypertrophy and marginal osteophytes contribute to moderate left foraminal stenosis, which appears progressed relative to the prior MRI from 2022.    results were after his recent neuro visit.  Also did not get his sleep study yet.  Did go to PT for a little bit, but was in [facility], now has been changed by neuro for [place] area to work on strengthening left side.  Pt feels he is about the same as when first dx with MS.   Was told had liver problems when seen at [facility]. Had labs and fibroscan. Was told likely from his MS med.  Kysenta; about one year Seeing bethany for pain  Drinking: hx of 2 beers occ. Not daily No other drug use history  Did fall yesterday on ice but didn't hurt self. Had hard time  getting up due to weakness left side. Left leg still drags.    Review of Systems Pertinent ROS items are noted in HPI.  Constitutional symptoms: negative Eyes:  negative Ear, nose, throat:  negative Cardiovascular:  negative Respiratory:  negative Gastrointestinal:  negative Genitourinary:  negative Skin:  right had dry, scaly.  Neurological:  negative Musculoskeletal:  joint pain or swelling and weakness Psychiatric:  negative Endocrine:  negative Hematological:  negative Allergic:  negative   The following portions of the patient's history were reviewed and updated as appropriate: allergies, current medications, PMH/PSH, past social history and problem list.   Past Medical/Surgical History:   Medical History[1] Surgical History[2]  Family History:   Family History[3]  Social History:   Social History[4] Tobacco Use History[5]   Allergies:   Peanut  Current Medications:   Current Medications[6]   Objective:   Vital Signs BP (!) 128/96   Pulse 104   Temp 97.2 F (36.2 C) (Temporal)   Ht 1.702 m (5' 7)   Wt 115 kg (253 lb 2 oz)   SpO2 96%   BMI 39.64 kg/m   BP Readings from Last 3 Encounters:  04/21/24 (!) 128/96  03/30/24 (!) 148/111  01/20/24 (!) 142/100   Wt Readings from Last 3 Encounters:  04/21/24 115 kg (253 lb 2 oz)  03/30/24 114 kg (250 lb 8 oz)  01/20/24 114 kg (251 lb 6.4 oz)   No LMP for male patient.  Physical Exam  Constitutional.  Well appearing 44 y.o. male, well developed, well  nourished, no acute distress. Weight stable, BP mildly elevated.  Respiratory.  Clear bilaterally, breathsounds equal, respirations unlabored. Cardiovascular.  Regular, nl S1, S2; no murmurs, gallops or rubs.  No lower extremity edema, 2+ peripheral pulses. Neuro: alert, oriented x 3, CN 2-12 intact bil, mild tenderness lower cervical spine, no redness.  Musculoskeletal: Strength 2+ right arm and left arm 1-2 + strength. No locking or clicking left  shoulder Skin: warm and dry with dorsal right hand dry, sandpaper feel, scaly Psych: cooperative, pleasant. ' Assessment/Plan:    Joe Martin was seen today for shoulder pain.  Diagnoses and all orders for this visit:  Chronic left shoulder pain  Eczema, unspecified type -     triamcinolone  acetonide (KENALOG) 0.1 % cream; Apply topically 2 (two) times a day. To affected area x 10 days, then prn  Fu with neuro for pain and bethany Will get copy of fibroscan/ labs.  Neuro did labs 03/30/24: normal CMP, neg hep panel, normal CBC. Neuro is addressing weakness and meds.  To call prn. Fu for cpe as sch He talks about prednisone but with his risks of infection on injection, advised would let neuro decide regarding this  Patient verbalizes understanding and in agreement with the above plan. All questions answered.    Medication side effects discussed with patient. Advised patient to call clinic or return for visit if these symptoms occur.   Goals of care discussed with patient including med compliance and adequate follow up.  Return for as scheduled.    This document serves as a record of services personally performed by Santana Molt, FN.  It was created on their behalf by Seldon GORMAN Ann, CMA, a trained medical scribe, and Certified Medical Assistant (CMA). During the course of documenting the history, physical exam and medical decision making, I was functioning as a stage manager. The creation of this record is the providers dictation and/or activities during the visit.  Electronically signed by Seldon GORMAN Ann, CMA 04/21/2024 1:54 PM    This document was created using the aid of voice recognition Dragon dictation software.   Santana Tarry Molt, FNP       [1] Past Medical History: Diagnosis Date   CNS demyelination (CMD)    Leg weakness, bilateral    Multiple sclerosis, relapsing-remitting (CMD) 03/03/2019   Numbness in both hands   [2] Past Surgical History: Procedure  Laterality Date   OTHER SURGICAL HISTORY     Procedure: OTHER SURGICAL HISTORY (no known surgery)   TRANSPHENOIDAL / TRANSNASAL HYPOPHYSECTOMY / RESECTION PITUITARY TUMOR N/A 01/12/2019   Procedure: TRANSPHENOIDAL HYPOPHYSECTOMY ENDOSCOPIC APPROACH;  Surgeon: Garnette Adine Bouquet, MD;  Location: [facility] MAIN OR;  Service: Neurosurgery;  Laterality: N/A;  [3] Family History Problem Relation Name Age of Onset   Multiple sclerosis Mother     Prostate cancer Father     Diabetes Brother     Anesthesia problems Neg Hx    [4] Social History Socioeconomic History   Marital status: Single  Tobacco Use   Smoking status: Former    Current packs/day: 0.50    Types: Cigarettes   Smokeless tobacco: Never  Vaping Use   Vaping status: Every Day   Substances: Nicotine, Flavoring  Substance and Sexual Activity   Alcohol use: Yes    Alcohol/week: 6.0 - 8.0 standard drinks of alcohol    Types: 6 - 8 Standard drinks or equivalent per week    Comment: 3 times a week   Drug use: Not Currently   Social  Drivers of Health   Living Situation: Low Risk (11/26/2022)   Living Situation    What is your living situation today?: I have a steady place to live    Think about the place you live. Do you have problems with any of the following? Choose all that apply:: None/None on this list  Food Insecurity: Low Risk (11/26/2022)   Food vital sign    Within the past 12 months, you worried that your food would run out before you got money to buy more: Never true    Within the past 12 months, the food you bought just didn't last and you didn't have money to get more: Never true  Transportation Needs: No Transportation Needs (11/26/2022)   Transportation    In the past 12 months, has lack of reliable transportation kept you from medical appointments, meetings, work or from getting things needed for daily living? : No  Utilities: Low Risk (11/26/2022)   Utilities    In the past 12 months has the  electric, gas, oil, or water company threatened to shut off services in your home? : No  Safety: Low Risk (11/26/2022)   Safety    How often does anyone, including family and friends, physically hurt you?: Never    How often does anyone, including family and friends, insult or talk down to you?: Never    How often does anyone, including family and friends, threaten you with harm?: Never    How often does anyone, including family and friends, scream or curse at you?: Never  Tobacco Use: Medium Risk (03/30/2024)   Patient History    Smoking Tobacco Use: Former    Smokeless Tobacco Use: Never  Depression: At Risk (04/21/2024)   PHQ-2    PHQ-2 Score: 3  [5] Social History Tobacco Use  Smoking Status Former   Current packs/day: 0.50   Types: Cigarettes  Smokeless Tobacco Never  [6] Current Outpatient Medications  Medication Sig Dispense Refill   amantadine (SYMMETREL) 100 mg capsule Take 1 capsule (100 mg total) by mouth daily. 90 capsule 3   baclofen (LIORESAL) 10 mg tablet Take 1 twice a day and 2 at night 360 tablet 3   buPROPion (WELLBUTRIN) 75 mg tablet TAKE 1 TABLET TWICE A DAY 60 tablet 0   chlorhexidine (PERIDEX) 0.12 % solution See Admin Instructions. PLEASE SEE ATTACHED FOR DETAILED DIRECTIONS     cholecalciferol (VITAMIN D3) 5,000 unit (125 mcg) tab tablet Take 5,000 Units by mouth daily.     dalfampridine (AMPYRA) 10 mg Take 1 tablet (10 mg total) by mouth every 12 (twelve) hours. 60 tablet 6   dextroamphetamine-amphetamine (ADDERALL XR) 20 mg 24 hr capsule Take 2 capsules (40 mg total) by mouth every morning. 60 capsule 0   DULoxetine (CYMBALTA) 30 mg capsule TAKE 1 CAPSULE by MOUTH TWICE DAILY 180 capsule 1   ferrous sulfate 325 mg (65 mg iron) EC tablet Take 325 mg by mouth daily with breakfast.     finasteride (PROPECIA) 1 mg tablet Take 1 mg by mouth Once Daily.     gabapentin (NEURONTIN) 600 mg tablet Take 1 tablet (600 mg total) by mouth 3 (three) times  a day. 90 tablet 5   ibuprofen  (MOTRIN ) 800 mg tablet take 1 tablet by mouth every 6 to 8 hours as needed     multivitamin cap Take 1 capsule by mouth Once Daily.     ofatumumab (Kesimpta Pen) 20 mg/0.4 mL pnij Inject 0.4 mLs (20 mg total) into  the skin every 28 days. 0.4 mL 11   ofatumumab (Kesimpta Pen) 20 mg/0.4 mL pnij Inject the contents of 1 pen into the skin on weeks 0, 1, and 2. Then begin monthly maintenance dosing starting on week 4. 1.2 mL 0   oxyBUTYnin (DITROPAN) 5 mg tablet Take 1 tablet (5 mg total) by mouth 2 (two) times a day. 180 tablet 1   traMADoL  (ULTRAM ) 50 mg tablet Take 1 tablet by mouth every 6 (six) hours as needed for pain.     triamcinolone  acetonide (KENALOG) 0.1 % cream Apply topically 2 (two) times a day. To affected area x 10 days, then prn 15 g 1   No current facility-administered medications for this visit.  "

## 2024-04-29 ENCOUNTER — Ambulatory Visit: Admitting: Physical Therapy

## 2024-04-29 VITALS — BP 137/100 | HR 103

## 2024-04-29 DIAGNOSIS — M6281 Muscle weakness (generalized): Secondary | ICD-10-CM

## 2024-04-29 DIAGNOSIS — R2689 Other abnormalities of gait and mobility: Secondary | ICD-10-CM

## 2024-04-29 DIAGNOSIS — R2681 Unsteadiness on feet: Secondary | ICD-10-CM

## 2024-04-29 NOTE — Therapy (Deleted)
 " OUTPATIENT PHYSICAL THERAPY NEURO EVALUATION   Patient Name: Joe Martin MRN: [id number] DOB:06-17-1980, 44 y.o., male Today's Date: 04/29/2024   PCP: Santana Molt, NP at [facility] REFERRING PROVIDER: Orysia Knee, MD (neurologist, [facility])  END OF SESSION:  PT End of Session - 04/29/24 1324     Visit Number 1    Number of Visits 9   with eval   Date for Recertification  07/08/24   to allow for scheduling delays   Authorization Type [employer]    PT Start Time 1323   pt arrived late   PT Stop Time 1359    PT Time Calculation (min) 36 min    Activity Tolerance Patient tolerated treatment well    Behavior During Therapy [facility] for tasks assessed/performed          No past medical history on file. No past surgical history on file. 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   CNS demyelination (HCC) 10/21/2018   Focal sensory loss 10/21/2018   Ptosis of eyelid, left 10/21/2018   Weakness of both lower extremities 10/21/2018   Cervical myelopathy (HCC) 10/21/2018    ONSET DATE: 03/30/2024  REFERRING DIAG: H64.D (ICD-10-CM) - Multiple sclerosis, unspecified  THERAPY DIAG:  Muscle weakness (generalized)  Other abnormalities of gait and mobility  Unsteadiness on feet  Rationale for Evaluation and Treatment: Rehabilitation  SUBJECTIVE:                                                                                                                                                                                             SUBJECTIVE STATEMENT: *** Wanting to regain strength, feels like grip strength is getting worse and worse; avoids holding heavy stuff on L side  Numbness, in feet, doesn't have a good sense of feeling on L side  Has near falls indoors, true falls outdoors due to icy/snowy conditions Has to lift L leg to get out of car Drives  occasionally, did drive today  Pretty sensitive to the heat, diagnosed with MS about 5 years ago Ashely stepp is primary neurologist   Pt accompanied by: self  PERTINENT HISTORY: ***  PAIN:  Are you having pain? No  PRECAUTIONS: Fall  RED FLAGS: None   WEIGHT BEARING RESTRICTIONS: Yes had 4 falls in the last 2 weeks due to icy/snowy conditions; reports no injuries and can get back up himself  FALLS: Has patient fallen in last 6 months? Yes. Number of falls ***  LIVING ENVIRONMENT: Lives with: lives alone Lives in: House/apartment  Stairs: No Has following equipment at home: None  PLOF: {PLOF:24004}  PATIENT GOALS: ***  OBJECTIVE:  Note: Objective measures were completed at Evaluation unless otherwise noted.  DIAGNOSTIC FINDINGS: ***  COGNITION: Overall cognitive status: {cognition:24006}   SENSATION: {sensation:27233} Numbness in L side of body but gets tingling in R foot Light touch decreased on L side as compared to R side  COORDINATION: ***  EDEMA:  {edema:24020} No swelling  MUSCLE TONE: {LE tone:25568}  MUSCLE LENGTH: Hamstrings: Right *** deg; Left *** deg Debby test: Right *** deg; Left *** deg  DTRs:  {DTR SITE:24025}  POSTURE: {posture:25561}  LOWER EXTREMITY ROM:     {AROM/PROM:27142}  Right Eval Left Eval  Hip flexion    Hip extension    Hip abduction    Hip adduction    Hip internal rotation    Hip external rotation    Knee flexion    Knee extension    Ankle dorsiflexion    Ankle plantarflexion    Ankle inversion    Ankle eversion     (Blank rows = not tested)  LOWER EXTREMITY MMT:    MMT Right Eval Left Eval  Hip flexion 5 3+  Hip extension    Hip abduction    Hip adduction    Hip internal rotation    Hip external rotation    Knee flexion 5 4+  Knee extension 5 4-  Ankle dorsiflexion 5 2-  Ankle plantarflexion    Ankle inversion    Ankle eversion    (Blank rows = not tested)  BED MOBILITY:  {bed  mobility:32615:p} Mod I but has to use this hands to assist with LLE  TRANSFERS: {transfers eval:32620}  RAMP:  {ramp eval:32616}  CURB:  {curb eval:32617}  STAIRS: {stairs eval:32618} GAIT: Findings: {GaitneuroPT:32644::Distance walked: ***,Comments: ***} Drags L leg, no hip or knee flexion, no ankle DF  FUNCTIONAL TESTS:  {Functional tests:24029}  [facility] PT Assessment - 04/29/24 1354       Ambulation/Gait   Gait velocity 32.8 ft over 14.75 sec = 2.22 ft/sec   no AD     Standardized Balance Assessment   Standardized Balance Assessment Timed Up and Go Test;Five Times Sit to Stand    Five times sit to stand comments  19.81 sec   no UE     Timed Up and Go Test   TUG Normal TUG    Normal TUG (seconds) 13.57   no AD          PATIENT SURVEYS:  {rehab surveys:24030}                                                                                                                              TREATMENT DATE: *** PT Evaluation  Self-Care Vitals:   04/29/24 1348 04/29/24 1350  BP: (!) 144/107 (!) 137/100  Pulse: (!) 101 (!) 103      PATIENT EDUCATION: Education details: *** Person educated: {Person educated:25204} Education  method: {Education Method:25205} Education comprehension: {Education Comprehension:25206}  HOME EXERCISE PROGRAM: ***  GOALS: Goals reviewed with patient? {yes/no:20286}  SHORT TERM GOALS: Target date: ***  *** Baseline: Goal status: INITIAL  2.  *** Baseline:  Goal status: INITIAL  3.  *** Baseline:  Goal status: INITIAL  4.  *** Baseline:  Goal status: INITIAL  5.  *** Baseline:  Goal status: INITIAL  6.  *** Baseline:  Goal status: INITIAL  LONG TERM GOALS: Target date: ***  *** Baseline:  Goal status: INITIAL  2.  *** Baseline:  Goal status: INITIAL  3.  *** Baseline:  Goal status: INITIAL  4.  *** Baseline:  Goal status: INITIAL  5.  *** Baseline:  Goal status: INITIAL  6.  *** Baseline:   Goal status: INITIAL  ASSESSMENT:  CLINICAL IMPRESSION: Patient is a *** year old *** referred to Neuro OPP[facility] for***.   Pt's PMH is significant for: *** The following deficits were present during the exam: ***. Based on ***, pt is an incr risk for falls. Pt would benefit from skilled PT to address these impairments and functional limitations to maximize functional mobility independence.   OBJECTIVE IMPAIRMENTS: {opptimpairments:25111}.   ACTIVITY LIMITATIONS: {activitylimitations:27494}  PARTICIPATION LIMITATIONS: {participationrestrictions:25113}  PERSONAL FACTORS: {Personal factors:25162} are also affecting patient's functional outcome.   REHAB POTENTIAL: {rehabpotential:25112}  CLINICAL DECISION MAKING: Evolving/moderate complexity  EVALUATION COMPLEXITY: Moderate  PLAN:  PT FREQUENCY: 1x/week  PT DURATION: 8 weeks  PLANNED INTERVENTIONS: {rehab planned interventions:25118::97110-Therapeutic exercises,97530- Therapeutic [phone]- Neuromuscular re-education,97535- Self Rjmz,02859- Manual therapy,Patient/Family education}  PLAN FOR NEXT SESSION: ***CHECK VITALS, trial L AFO?   [place] Sloop, PT Waddell [place], PT, DPT, CSRS  04/29/2024, 3:53 PM  For all possible CPT codes, reference the Planned Interventions line above.     Check all conditions that are expected to impact treatment: {Conditions expected to impact treatment:{Conditions expected to impact treatment:28273}   If treatment provided at initial evaluation, no treatment charged due to lack of authorization.             "

## 2024-04-29 NOTE — Therapy (Incomplete)
 " OUTPATIENT PHYSICAL THERAPY NEURO EVALUATION   Patient Name: Joe Martin MRN: [id number] DOB:12-18-1980, 44 y.o., male Today's Date: 04/29/2024   PCP: Santana Molt, NP at [facility] ([facility] REFERRING PROVIDER: Orysia Knee, MD (neurologist, [facility])  END OF SESSION:  PT End of Session - 04/29/24 1324     Visit Number 1    Number of Visits 9   with eval   Date for Recertification  07/08/24   to allow for scheduling delays   Authorization Type [employer]    PT Start Time 1323   pt arrived late   PT Stop Time 1359    PT Time Calculation (min) 36 min    Activity Tolerance Patient tolerated treatment well    Behavior During Therapy [facility] for tasks assessed/performed          No past medical history on file. No past surgical history on file. 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   CNS demyelination (HCC) 10/21/2018   Focal sensory loss 10/21/2018   Ptosis of eyelid, left 10/21/2018   Weakness of both lower extremities 10/21/2018   Cervical myelopathy (HCC) 10/21/2018    ONSET DATE: 03/30/2024  REFERRING DIAG: H64.D (ICD-10-CM) - Multiple sclerosis, unspecified  THERAPY DIAG:  Muscle weakness (generalized)  Other abnormalities of gait and mobility  Unsteadiness on feet  Rationale for Evaluation and Treatment: Rehabilitation  SUBJECTIVE:                                                                                                                                                                                             SUBJECTIVE STATEMENT: *** Wanting to regain strength, feels like grip strength is getting worse and worse; avoids holding heavy stuff on L side  Numbness, in feet, doesn't have a good sense of feeling on L side  Has near falls indoors, true falls outdoors due to icy/snowy conditions Has to lift L leg to get out of car Drives  occasionally, did drive today  Pretty sensitive to the heat, diagnosed with MS about 5 years ago Ashely stepp is primary neurologist   Pt accompanied by: self  PERTINENT HISTORY: ***  PAIN:  Are you having pain? No  PRECAUTIONS: Fall  RED FLAGS: None   WEIGHT BEARING RESTRICTIONS: Yes had 4 falls in the last 2 weeks due to icy/snowy conditions; reports no injuries and can get back up himself  FALLS: Has patient fallen in last 6 months? Yes. Number of falls ***  LIVING ENVIRONMENT: Lives with: lives alone Lives in: House/apartment  Stairs: No Has following equipment at home: None  PLOF: {PLOF:24004}  PATIENT GOALS: ***  OBJECTIVE:  Note: Objective measures were completed at Evaluation unless otherwise noted.  DIAGNOSTIC FINDINGS: ***  COGNITION: Overall cognitive status: {cognition:24006}   SENSATION: {sensation:27233} Numbness in L side of body but gets tingling in R foot Light touch decreased on L side as compared to R side  COORDINATION: ***  EDEMA:  {edema:24020} No swelling  MUSCLE TONE: {LE tone:25568}  MUSCLE LENGTH: Hamstrings: Right *** deg; Left *** deg Debby test: Right *** deg; Left *** deg  DTRs:  {DTR SITE:24025}  POSTURE: {posture:25561}  LOWER EXTREMITY ROM:     {AROM/PROM:27142}  Right Eval Left Eval  Hip flexion    Hip extension    Hip abduction    Hip adduction    Hip internal rotation    Hip external rotation    Knee flexion    Knee extension    Ankle dorsiflexion    Ankle plantarflexion    Ankle inversion    Ankle eversion     (Blank rows = not tested)  LOWER EXTREMITY MMT:    MMT Right Eval Left Eval  Hip flexion 5 3+  Hip extension    Hip abduction    Hip adduction    Hip internal rotation    Hip external rotation    Knee flexion 5 4+  Knee extension 5 4-  Ankle dorsiflexion 5 2-  Ankle plantarflexion    Ankle inversion    Ankle eversion    (Blank rows = not tested)  BED MOBILITY:  {bed  mobility:32615:p} Mod I but has to use this hands to assist with LLE  TRANSFERS: {transfers eval:32620}  RAMP:  {ramp eval:32616}  CURB:  {curb eval:32617}  STAIRS: {stairs eval:32618} GAIT: Findings: {GaitneuroPT:32644::Distance walked: ***,Comments: ***} Drags L leg, no hip or knee flexion, no ankle DF  FUNCTIONAL TESTS:  {Functional tests:24029}  [facility] PT Assessment - 04/29/24 1354       Ambulation/Gait   Gait velocity 32.8 ft over 14.75 sec = 2.22 ft/sec   no AD     Standardized Balance Assessment   Standardized Balance Assessment Timed Up and Go Test;Five Times Sit to Stand    Five times sit to stand comments  19.81 sec   no UE     Timed Up and Go Test   TUG Normal TUG    Normal TUG (seconds) 13.57   no AD          PATIENT SURVEYS:  {rehab surveys:24030}                                                                                                                              TREATMENT DATE: *** PT Evaluation  Self-Care Vitals:   04/29/24 1348 04/29/24 1350  BP: (!) 144/107 (!) 137/100  Pulse: (!) 101 (!) 103      PATIENT EDUCATION: Education details: *** Person educated: {Person educated:25204} Education  method: {Education Method:25205} Education comprehension: {Education Comprehension:25206}  HOME EXERCISE PROGRAM: ***  GOALS: Goals reviewed with patient? {yes/no:20286}  SHORT TERM GOALS: Target date: ***  *** Baseline: Goal status: INITIAL  2.  *** Baseline:  Goal status: INITIAL  3.  *** Baseline:  Goal status: INITIAL  4.  *** Baseline:  Goal status: INITIAL  5.  *** Baseline:  Goal status: INITIAL  6.  *** Baseline:  Goal status: INITIAL  LONG TERM GOALS: Target date: ***  *** Baseline:  Goal status: INITIAL  2.  *** Baseline:  Goal status: INITIAL  3.  *** Baseline:  Goal status: INITIAL  4.  *** Baseline:  Goal status: INITIAL  5.  *** Baseline:  Goal status: INITIAL  6.  *** Baseline:   Goal status: INITIAL  ASSESSMENT:  CLINICAL IMPRESSION: Patient is a *** year old *** referred to Neuro [facility] for***.   Pt's PMH is significant for: *** The following deficits were present during the exam: ***. Based on ***, pt is an incr risk for falls. Pt would benefit from skilled PT to address these impairments and functional limitations to maximize functional mobility independence.   OBJECTIVE IMPAIRMENTS: {opptimpairments:25111}.   ACTIVITY LIMITATIONS: {activitylimitations:27494}  PARTICIPATION LIMITATIONS: {participationrestrictions:25113}  PERSONAL FACTORS: {Personal factors:25162} are also affecting patient's functional outcome.   REHAB POTENTIAL: {rehabpotential:25112}  CLINICAL DECISION MAKING: Evolving/moderate complexity  EVALUATION COMPLEXITY: Moderate  PLAN:  PT FREQUENCY: 1x/week  PT DURATION: 8 weeks  PLANNED INTERVENTIONS: {rehab planned interventions:25118::97110-Therapeutic exercises,97530- Therapeutic [phone]- Neuromuscular re-education,97535- Self Rjmz,02859- Manual therapy,Patient/Family education}  PLAN FOR NEXT SESSION: ***CHECK VITALS, trial L AFO? Request OT referral   Waddell Southgate, PT Waddell Southgate, PT, DPT, CSRS  04/29/2024, 1:59 PM  For all possible CPT codes, reference the Planned Interventions line above.     Check all conditions that are expected to impact treatment: {Conditions expected to impact treatment:{Conditions expected to impact treatment:28273}   If treatment provided at initial evaluation, no treatment charged due to lack of authorization.             "

## 2024-05-10 ENCOUNTER — Ambulatory Visit: Admitting: Physical Therapy

## 2024-05-17 ENCOUNTER — Ambulatory Visit: Admitting: Physical Therapy

## 2024-05-24 ENCOUNTER — Ambulatory Visit: Admitting: Physical Therapy

## 2024-05-31 ENCOUNTER — Ambulatory Visit: Admitting: Physical Therapy

## 2024-06-07 ENCOUNTER — Ambulatory Visit: Admitting: Physical Therapy

## 2024-06-14 ENCOUNTER — Ambulatory Visit: Admitting: Physical Therapy

## 2024-06-21 ENCOUNTER — Ambulatory Visit: Admitting: Physical Therapy

## 2024-06-27 ENCOUNTER — Ambulatory Visit: Admitting: Physical Therapy
